# Patient Record
Sex: Female | Born: 1960 | Race: White | Hispanic: No | Marital: Married | Smoking: Never smoker
Health system: Southern US, Community
[De-identification: ages and names within clinical notes are randomized; demographics above are authoritative.]

## PROBLEM LIST (undated history)

## (undated) DIAGNOSIS — IMO0002 Reserved for concepts with insufficient information to code with codable children: Secondary | ICD-10-CM

## (undated) DIAGNOSIS — E785 Hyperlipidemia, unspecified: Secondary | ICD-10-CM

## (undated) DIAGNOSIS — J302 Other seasonal allergic rhinitis: Secondary | ICD-10-CM

## (undated) DIAGNOSIS — C801 Malignant (primary) neoplasm, unspecified: Secondary | ICD-10-CM

## (undated) DIAGNOSIS — I10 Essential (primary) hypertension: Secondary | ICD-10-CM

## (undated) HISTORY — PX: ABDOMINAL HYSTERECTOMY: SHX81

## (undated) HISTORY — PX: TONSILLECTOMY: SUR1361

## (undated) HISTORY — DX: Essential (primary) hypertension: I10

## (undated) HISTORY — DX: Reserved for concepts with insufficient information to code with codable children: IMO0002

---

## 1998-08-29 ENCOUNTER — Other Ambulatory Visit: Admission: RE | Admit: 1998-08-29 | Discharge: 1998-08-29 | Payer: Self-pay | Admitting: *Deleted

## 1999-09-06 ENCOUNTER — Other Ambulatory Visit: Admission: RE | Admit: 1999-09-06 | Discharge: 1999-09-06 | Payer: Self-pay | Admitting: *Deleted

## 1999-10-29 ENCOUNTER — Encounter: Payer: Self-pay | Admitting: *Deleted

## 1999-10-29 ENCOUNTER — Encounter: Admission: RE | Admit: 1999-10-29 | Discharge: 1999-10-29 | Payer: Self-pay | Admitting: *Deleted

## 2000-09-15 ENCOUNTER — Other Ambulatory Visit: Admission: RE | Admit: 2000-09-15 | Discharge: 2000-09-15 | Payer: Self-pay | Admitting: *Deleted

## 2001-09-22 ENCOUNTER — Other Ambulatory Visit: Admission: RE | Admit: 2001-09-22 | Discharge: 2001-09-22 | Payer: Self-pay | Admitting: *Deleted

## 2002-09-27 ENCOUNTER — Other Ambulatory Visit: Admission: RE | Admit: 2002-09-27 | Discharge: 2002-09-27 | Payer: Self-pay | Admitting: *Deleted

## 2003-10-10 ENCOUNTER — Other Ambulatory Visit: Admission: RE | Admit: 2003-10-10 | Discharge: 2003-10-10 | Payer: Self-pay | Admitting: *Deleted

## 2003-11-22 ENCOUNTER — Encounter: Admission: RE | Admit: 2003-11-22 | Discharge: 2003-11-22 | Payer: Self-pay | Admitting: *Deleted

## 2004-10-10 ENCOUNTER — Other Ambulatory Visit: Admission: RE | Admit: 2004-10-10 | Discharge: 2004-10-10 | Payer: Self-pay | Admitting: *Deleted

## 2004-11-22 ENCOUNTER — Encounter: Admission: RE | Admit: 2004-11-22 | Discharge: 2004-11-22 | Payer: Self-pay | Admitting: *Deleted

## 2004-12-03 ENCOUNTER — Other Ambulatory Visit: Admission: RE | Admit: 2004-12-03 | Discharge: 2004-12-03 | Payer: Self-pay | Admitting: *Deleted

## 2004-12-23 DIAGNOSIS — IMO0002 Reserved for concepts with insufficient information to code with codable children: Secondary | ICD-10-CM

## 2004-12-23 DIAGNOSIS — R87619 Unspecified abnormal cytological findings in specimens from cervix uteri: Secondary | ICD-10-CM

## 2004-12-23 HISTORY — DX: Unspecified abnormal cytological findings in specimens from cervix uteri: R87.619

## 2004-12-23 HISTORY — DX: Reserved for concepts with insufficient information to code with codable children: IMO0002

## 2004-12-23 HISTORY — PX: LEEP: SHX91

## 2005-06-04 ENCOUNTER — Other Ambulatory Visit: Admission: RE | Admit: 2005-06-04 | Discharge: 2005-06-04 | Payer: Self-pay | Admitting: *Deleted

## 2005-10-02 ENCOUNTER — Other Ambulatory Visit: Admission: RE | Admit: 2005-10-02 | Discharge: 2005-10-02 | Payer: Self-pay | Admitting: *Deleted

## 2005-12-11 ENCOUNTER — Encounter: Admission: RE | Admit: 2005-12-11 | Discharge: 2005-12-11 | Payer: Self-pay | Admitting: *Deleted

## 2006-10-16 ENCOUNTER — Other Ambulatory Visit: Admission: RE | Admit: 2006-10-16 | Discharge: 2006-10-16 | Payer: Self-pay | Admitting: *Deleted

## 2006-12-12 ENCOUNTER — Encounter: Admission: RE | Admit: 2006-12-12 | Discharge: 2006-12-12 | Payer: Self-pay | Admitting: *Deleted

## 2007-12-31 ENCOUNTER — Other Ambulatory Visit: Admission: RE | Admit: 2007-12-31 | Discharge: 2007-12-31 | Payer: Self-pay | Admitting: *Deleted

## 2008-02-16 ENCOUNTER — Encounter: Admission: RE | Admit: 2008-02-16 | Discharge: 2008-02-16 | Payer: Self-pay | Admitting: *Deleted

## 2009-06-22 ENCOUNTER — Encounter: Admission: RE | Admit: 2009-06-22 | Discharge: 2009-06-22 | Payer: Self-pay | Admitting: Obstetrics & Gynecology

## 2009-06-28 ENCOUNTER — Encounter: Admission: RE | Admit: 2009-06-28 | Discharge: 2009-06-28 | Payer: Self-pay | Admitting: Obstetrics & Gynecology

## 2010-07-23 ENCOUNTER — Encounter: Admission: RE | Admit: 2010-07-23 | Discharge: 2010-07-23 | Payer: Self-pay | Admitting: Obstetrics and Gynecology

## 2010-07-25 ENCOUNTER — Encounter
Admission: RE | Admit: 2010-07-25 | Discharge: 2010-07-25 | Payer: Self-pay | Source: Home / Self Care | Admitting: Obstetrics and Gynecology

## 2011-01-12 ENCOUNTER — Inpatient Hospital Stay (HOSPITAL_COMMUNITY)
Admission: EM | Admit: 2011-01-12 | Discharge: 2011-01-12 | Payer: Self-pay | Source: Home / Self Care | Attending: Internal Medicine | Admitting: Internal Medicine

## 2011-01-13 ENCOUNTER — Encounter: Payer: Self-pay | Admitting: *Deleted

## 2011-01-14 ENCOUNTER — Encounter: Payer: Self-pay | Admitting: Obstetrics & Gynecology

## 2011-07-05 ENCOUNTER — Other Ambulatory Visit: Payer: Self-pay | Admitting: Obstetrics and Gynecology

## 2011-07-05 DIAGNOSIS — Z1231 Encounter for screening mammogram for malignant neoplasm of breast: Secondary | ICD-10-CM

## 2011-07-31 ENCOUNTER — Ambulatory Visit
Admission: RE | Admit: 2011-07-31 | Discharge: 2011-07-31 | Disposition: A | Discharge status: Home / Self Care | Payer: PRIVATE HEALTH INSURANCE | Source: Ambulatory Visit | Attending: Obstetrics and Gynecology | Admitting: Obstetrics and Gynecology

## 2011-07-31 DIAGNOSIS — Z1231 Encounter for screening mammogram for malignant neoplasm of breast: Secondary | ICD-10-CM

## 2012-10-20 ENCOUNTER — Other Ambulatory Visit: Payer: Self-pay | Admitting: Obstetrics and Gynecology

## 2012-10-20 DIAGNOSIS — Z1231 Encounter for screening mammogram for malignant neoplasm of breast: Secondary | ICD-10-CM

## 2012-12-01 ENCOUNTER — Ambulatory Visit
Admission: RE | Admit: 2012-12-01 | Discharge: 2012-12-01 | Disposition: A | Discharge status: Home / Self Care | Payer: PRIVATE HEALTH INSURANCE | Source: Ambulatory Visit | Attending: Obstetrics and Gynecology | Admitting: Obstetrics and Gynecology

## 2012-12-01 DIAGNOSIS — Z1231 Encounter for screening mammogram for malignant neoplasm of breast: Secondary | ICD-10-CM

## 2012-12-03 ENCOUNTER — Other Ambulatory Visit: Payer: Self-pay | Admitting: Obstetrics and Gynecology

## 2012-12-03 DIAGNOSIS — R928 Other abnormal and inconclusive findings on diagnostic imaging of breast: Secondary | ICD-10-CM

## 2012-12-09 ENCOUNTER — Other Ambulatory Visit: Payer: Self-pay | Admitting: Obstetrics and Gynecology

## 2012-12-09 DIAGNOSIS — R928 Other abnormal and inconclusive findings on diagnostic imaging of breast: Secondary | ICD-10-CM

## 2012-12-18 ENCOUNTER — Ambulatory Visit
Admission: RE | Admit: 2012-12-18 | Discharge: 2012-12-18 | Disposition: A | Discharge status: Home / Self Care | Payer: PRIVATE HEALTH INSURANCE | Source: Ambulatory Visit | Attending: Obstetrics and Gynecology | Admitting: Obstetrics and Gynecology

## 2012-12-18 DIAGNOSIS — R928 Other abnormal and inconclusive findings on diagnostic imaging of breast: Secondary | ICD-10-CM

## 2013-07-12 ENCOUNTER — Ambulatory Visit: Payer: Self-pay | Admitting: Nurse Practitioner

## 2013-07-22 ENCOUNTER — Encounter: Payer: Self-pay | Admitting: *Deleted

## 2013-07-26 ENCOUNTER — Encounter: Payer: Self-pay | Admitting: Nurse Practitioner

## 2013-07-26 ENCOUNTER — Ambulatory Visit (INDEPENDENT_AMBULATORY_CARE_PROVIDER_SITE_OTHER): Payer: PRIVATE HEALTH INSURANCE | Admitting: Nurse Practitioner

## 2013-07-26 VITALS — BP 116/70 | HR 72 | Ht 64.75 in | Wt 127.0 lb

## 2013-07-26 DIAGNOSIS — Z0000 Encounter for general adult medical examination without abnormal findings: Secondary | ICD-10-CM

## 2013-07-26 DIAGNOSIS — Z01419 Encounter for gynecological examination (general) (routine) without abnormal findings: Secondary | ICD-10-CM

## 2013-07-26 NOTE — Patient Instructions (Signed)

## 2013-07-26 NOTE — Progress Notes (Addendum)
Patient ID: Zoe Sherman, female   DOB: 1961-11-17, 52 y.o.   MRN: [id number] 52 y.o. G2P2 Married Caucasian Fe here for annual exam.  Menses have been a little irregular but never over 90 days.  She came in for a consult visit with Dr. Tresa Res in November 2013.  It was decided that she would take Lo Loestrin which she only took for 1 month.  She did not like side effects and how she felt so discontinued. Menses since then are as follows: Jan 20, March 17; April 29; May 24; July 14.  Usually last X 7 days light to moderate flow. No cramps..  Then sometimes a week later spotting with wiping that is light pink that goes away in 1-2 days. Rare night sweats. She feels she does not want hormonal intervention.  Patient's last menstrual period was 07/05/2013.          Sexually active: yes  The current method of family planning is vasectomy.    Exercising: yes  Home exercise routine includes walking .75 hrs per 5 days per week. Smoker:  no  Health Maintenance: Pap:  07/09/12, WNL MMG:  12/18/12, BI-Rads 1 negative after repeat films, no biopsy Colonoscopy:  Never, aware that we can schedule for her.  TDaP:  05/2013 Labs: PCP   reports that she has never smoked. She has never used smokeless tobacco. She reports that she does not drink alcohol or use illicit drugs.  Past Medical History  Diagnosis Date  . Abnormal Pap smear     Past Surgical History  Procedure Laterality Date  . Leep  2006  . Cesarean section  03/1992  . Vaginal delivery  12/1989    Current Outpatient Prescriptions  Medication Sig Dispense Refill  . ibuprofen (ADVIL,MOTRIN) 100 MG tablet Take 100 mg by mouth every 6 (six) hours as needed for fever.       No current facility-administered medications for this visit.    Family History  Problem Relation Age of Onset  . Hypertension Mother   . Hypertension Father   . Diabetes Father   . Hyperlipidemia Father   . Obesity Brother   . Heart failure Paternal Aunt   . Heart  failure Paternal Grandfather     ROS:  Pertinent items are noted in HPI.  Otherwise, a comprehensive ROS was negative.  Exam:   BP 116/70  Pulse 72  Ht 5' 4.75" (1.645 m)  Wt 127 lb (57.607 kg)  BMI 21.29 kg/m2  LMP 07/05/2013 Height: 5' 4.75" (164.5 cm)  Ht Readings from Last 3 Encounters:  07/26/13 5' 4.75" (1.645 m)    General appearance: alert, cooperative and appears stated age Head: Normocephalic, without obvious abnormality, atraumatic Neck: no adenopathy, supple, symmetrical, trachea midline and thyroid normal to inspection and palpation Lungs: clear to auscultation bilaterally Breasts: normal appearance, no masses or tenderness Heart: regular rate and rhythm Abdomen: soft, non-tender; no masses,  no organomegaly Extremities: extremities normal, atraumatic, no cyanosis or edema Skin: Skin color, texture, turgor normal. No rashes or lesions Lymph nodes: Cervical, supraclavicular, and axillary nodes normal. No abnormal inguinal nodes palpated Neurologic: Grossly normal   Pelvic: External genitalia:  no lesions              Urethra:  normal appearing urethra with no masses, tenderness or lesions              Bartholin's and Skene's: normal  Vagina: normal appearing vagina with normal color and discharge, no lesions              Cervix: anteverted              Pap taken: yesand HR HPV Bimanual Exam:  Uterus:  normal size, contour, position, consistency, mobility, non-tender              Adnexa: no mass, fullness, tenderness               Rectovaginal: Confirms               Anus:  normal sphincter tone, no lesions  A:  Well Woman with normal exam  Perimenopausal consistent with irregular cycles  History of  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  Will CB if vaso symptoms worsen  counseled on  breast self exam, adequate intake of calcium and vitamin D, diet and exercise  [facility], in past tried to get records from Dr. Delorse Lek office. = old records reviewed see note above and scanned. return annually or prn  An After Visit Summary was printed and given to the patient.

## 2013-07-28 NOTE — Progress Notes (Signed)
Encounter reviewed by Dr. Brook Silva.  

## 2013-08-20 ENCOUNTER — Encounter: Payer: Self-pay | Admitting: Nurse Practitioner

## 2013-11-23 ENCOUNTER — Other Ambulatory Visit: Payer: Self-pay

## 2013-11-23 DIAGNOSIS — Z1231 Encounter for screening mammogram for malignant neoplasm of breast: Secondary | ICD-10-CM

## 2014-01-03 ENCOUNTER — Ambulatory Visit
Admission: RE | Admit: 2014-01-03 | Discharge: 2014-01-03 | Disposition: A | Discharge status: Home / Self Care | Payer: PRIVATE HEALTH INSURANCE | Source: Ambulatory Visit

## 2014-01-03 DIAGNOSIS — Z1231 Encounter for screening mammogram for malignant neoplasm of breast: Secondary | ICD-10-CM

## 2014-08-01 ENCOUNTER — Encounter: Payer: Self-pay | Admitting: Nurse Practitioner

## 2014-08-01 ENCOUNTER — Ambulatory Visit (INDEPENDENT_AMBULATORY_CARE_PROVIDER_SITE_OTHER): Payer: PRIVATE HEALTH INSURANCE | Admitting: Nurse Practitioner

## 2014-08-01 VITALS — BP 130/70 | HR 72 | Ht 65.0 in | Wt 132.0 lb

## 2014-08-01 DIAGNOSIS — Z01419 Encounter for gynecological examination (general) (routine) without abnormal findings: Secondary | ICD-10-CM

## 2014-08-01 DIAGNOSIS — Z1211 Encounter for screening for malignant neoplasm of colon: Secondary | ICD-10-CM

## 2014-08-01 NOTE — Progress Notes (Signed)
Patient ID: Zoe Sherman, female   DOB: 30-Sep-1961, 53 y.o.   MRN: [id number] 53 y.o. G2P2 Married Caucasian Fe here for annual exam.  Menses is irregular this past year. PMP 02/2014.  Then amenorrhea until 07/15/14. Some vaso symptoms that are worse at night.   Maybe 6 cycles this past year.  No vaginal dryness.   Patient's last menstrual period was 07/15/2014.          Sexually active: yes  The current method of family planning is vasectomy.  Exercising: yes Home exercise routine includes walking 2-2.5 milers 4-5 times per week.  Smoker: no   Health Maintenance:  Pap: 07/26/13, WNL, neg HR HPV MMG:  01/03/14, 3D, Bi-Rads 1:  Negative  Colonoscopy: Never, aware that we can schedule for her.  TDaP: 05/2013  Labs: PCP   reports that she has never smoked. She has never used smokeless tobacco. She reports that she does not drink alcohol or use illicit drugs.  Past Medical History  Diagnosis Date  . Abnormal Pap smear 2006    ??? LEEP Dr. Randell Patient - unable to get records.    Past Surgical History  Procedure Laterality Date  . Leep  2006    pathology shows CIN I with HPV effect, no squamous intraepitheal lesion  . Cesarean section  03/1992  . Vaginal delivery  12/1989    Current Outpatient Prescriptions  Medication Sig Dispense Refill  . ibuprofen (ADVIL,MOTRIN) 100 MG tablet Take 100 mg by mouth every 6 (six) hours as needed for fever.       No current facility-administered medications for this visit.    Family History  Problem Relation Age of Onset  . Hypertension Mother   . Hypertension Father   . Diabetes Father   . Hyperlipidemia Father   . Obesity Brother   . Heart failure Paternal Aunt   . Heart failure Paternal Grandfather   . Obesity Sister   . Hypertension Sister   . Hypertension Sister   . Obesity Sister     ROS:  Pertinent items are noted in HPI.  Otherwise, a comprehensive ROS was negative.  Exam:   BP 130/70  Pulse 72  Ht 5\' 5"  (1.651 m)  Wt 132 lb (59.875 kg)   BMI 21.97 kg/m2  LMP 07/15/2014 Height: 5\' 5"  (165.1 cm)  Ht Readings from Last 3 Encounters:  08/01/14 5\' 5"  (1.651 m)  07/26/13 5' 4.75" (1.645 m)    General appearance: alert, cooperative and appears stated age Head: Normocephalic, without obvious abnormality, atraumatic Neck: no adenopathy, supple, symmetrical, trachea midline and thyroid normal to inspection and palpation Lungs: clear to auscultation bilaterally Breasts: normal appearance, no masses or tenderness Heart: regular rate and rhythm Abdomen: soft, non-tender; no masses,  no organomegaly Extremities: extremities normal, atraumatic, no cyanosis or edema Skin: Skin color, texture, turgor normal. No rashes or lesions Lymph nodes: Cervical, supraclavicular, and axillary nodes normal. No abnormal inguinal nodes palpated Neurologic: Grossly normal   Pelvic: External genitalia:  no lesions              Urethra:  normal appearing urethra with no masses, tenderness or lesions              Bartholin's and Skene's: normal                 Vagina: normal appearing vagina with normal color and discharge, no lesions              Cervix: anteverted  Pap taken: No. Bimanual Exam:  Uterus:  normal size, contour, position, consistency, mobility, non-tender              Adnexa: no mass, fullness, tenderness               Rectovaginal: Confirms               Anus:  normal sphincter tone, no lesions  A:  Well Woman with normal exam  Perimenopause C/W irregular menses    P:   Reviewed health and wellness pertinent to exam  Pap smear not taken today  Mammogram is due 12/2014  Will schedule colonoscopy with Dr. Loreta AveMann  Will monitor menses calendar and if no menses in 3 months to call back for Provera  Counseled on breast self exam, mammography screening, adequate intake of calcium and vitamin D, diet and exercise return annually or prn  An After Visit Summary was printed and given to the patient.

## 2014-08-01 NOTE — Patient Instructions (Signed)

## 2014-08-08 NOTE — Progress Notes (Signed)
Encounter reviewed by Dr. Shanyn Preisler Silva.  

## 2014-10-24 ENCOUNTER — Encounter: Payer: Self-pay | Admitting: Nurse Practitioner

## 2015-01-03 ENCOUNTER — Other Ambulatory Visit: Payer: Self-pay

## 2015-01-03 DIAGNOSIS — Z1231 Encounter for screening mammogram for malignant neoplasm of breast: Secondary | ICD-10-CM

## 2015-01-16 ENCOUNTER — Ambulatory Visit
Admission: RE | Admit: 2015-01-16 | Discharge: 2015-01-16 | Disposition: A | Discharge status: Home / Self Care | Payer: PRIVATE HEALTH INSURANCE | Source: Ambulatory Visit

## 2015-01-16 DIAGNOSIS — Z1231 Encounter for screening mammogram for malignant neoplasm of breast: Secondary | ICD-10-CM

## 2015-08-07 ENCOUNTER — Encounter: Payer: Self-pay | Admitting: Nurse Practitioner

## 2015-08-07 ENCOUNTER — Ambulatory Visit (INDEPENDENT_AMBULATORY_CARE_PROVIDER_SITE_OTHER): Payer: PRIVATE HEALTH INSURANCE | Admitting: Nurse Practitioner

## 2015-08-07 VITALS — BP 118/66 | HR 72 | Ht 64.75 in | Wt 130.0 lb

## 2015-08-07 DIAGNOSIS — Z0000 Encounter for general adult medical examination without abnormal findings: Secondary | ICD-10-CM

## 2015-08-07 DIAGNOSIS — Z01419 Encounter for gynecological examination (general) (routine) without abnormal findings: Secondary | ICD-10-CM | POA: Diagnosis not present

## 2015-08-07 DIAGNOSIS — Z1211 Encounter for screening for malignant neoplasm of colon: Secondary | ICD-10-CM | POA: Diagnosis not present

## 2015-08-07 DIAGNOSIS — N912 Amenorrhea, unspecified: Secondary | ICD-10-CM | POA: Diagnosis not present

## 2015-08-07 LAB — POCT URINALYSIS DIPSTICK
BILIRUBIN UA: NEGATIVE
Blood, UA: NEGATIVE
GLUCOSE UA: NEGATIVE
Ketones, UA: NEGATIVE
Leukocytes, UA: NEGATIVE
NITRITE UA: NEGATIVE
Protein, UA: NEGATIVE
UROBILINOGEN UA: NEGATIVE
pH, UA: 6

## 2015-08-07 MED ORDER — MEDROXYPROGESTERONE ACETATE 10 MG PO TABS: 10.0000 mg | ORAL_TABLET | Freq: Every day | ORAL | Status: DC

## 2015-08-07 NOTE — Patient Instructions (Addendum)

## 2015-08-07 NOTE — Progress Notes (Signed)
Patient ID: Zoe Sherman, female   DOB: Jan 30, 1961, 54 y.o.   MRN: [id number] 54 y.o. G2P0002 Married  Caucasian Fe here for annual exam.  Menses last July 2015, then amenorrhea until April 2016 which was normal and not heavy.  She has only minor vaso symptoms and thinks they are better than last year.  Oldest son now married for 2 years.  The youngest son is getting married on 09/02/15.  Patient's last menstrual period was 03/24/2015 (approximate).          Sexually active: Yes.    The current method of family planning is none.    Exercising: Yes.    walking 1.5-2 miles everyday weather permits Smoker:  no  Health Maintenance: Pap: 07/26/13, WNL, neg HR HPV MMG: 01/16/15, 3D, Bi-Rads 1: Negative  Colonoscopy: Never  TDaP: 05/2013  Labs:  PCP  Urine:  Negative    reports that she has never smoked. She has never used smokeless tobacco. She reports that she does not drink alcohol or use illicit drugs.  Past Medical History  Diagnosis Date  . Abnormal Pap smear 2006    ??? LEEP Dr. Randell Patient - unable to get records.    Past Surgical History  Procedure Laterality Date  . Leep  2006    pathology shows CIN I with HPV effect, no squamous intraepitheal lesion  . Cesarean section  03/1992  . Vaginal delivery  12/1989    Current Outpatient Prescriptions  Medication Sig Dispense Refill  . ibuprofen (ADVIL,MOTRIN) 100 MG tablet Take 100 mg by mouth every 6 (six) hours as needed for fever.    . medroxyPROGESTERone (PROVERA) 10 MG tablet Take 1 tablet (10 mg total) by mouth daily. 10 tablet 0   No current facility-administered medications for this visit.    Family History  Problem Relation Age of Onset  . Hypertension Mother   . Hypertension Father   . Diabetes Father   . Hyperlipidemia Father   . Obesity Brother   . Heart failure Paternal Aunt   . Heart failure Paternal Grandfather   . Obesity Sister   . Hypertension Sister   . Hypertension Sister   . Obesity Sister     ROS:   Pertinent items are noted in HPI.  Otherwise, a comprehensive ROS was negative.  Exam:   BP 118/66 mmHg  Pulse 72  Ht 5' 4.75" (1.645 m)  Wt 130 lb (58.968 kg)  BMI 21.79 kg/m2  LMP 03/24/2015 (Approximate) Height: 5' 4.75" (164.5 cm) Ht Readings from Last 3 Encounters:  08/07/15 5' 4.75" (1.645 m)  08/01/14 5\' 5"  (1.651 m)  07/26/13 5' 4.75" (1.645 m)    General appearance: alert, cooperative and appears stated age Head: Normocephalic, without obvious abnormality, atraumatic Neck: no adenopathy, supple, symmetrical, trachea midline and thyroid normal to inspection and palpation Lungs: clear to auscultation bilaterally Breasts: normal appearance, no masses or tenderness Heart: regular rate and rhythm Abdomen: soft, non-tender; no masses,  no organomegaly Extremities: extremities normal, atraumatic, no cyanosis or edema Skin: Skin color, texture, turgor normal. No rashes or lesions Lymph nodes: Cervical, supraclavicular, and axillary nodes normal. No abnormal inguinal nodes palpated Neurologic: Grossly normal   Pelvic: External genitalia:  no lesions              Urethra:  normal appearing urethra with no masses, tenderness or lesions              Bartholin's and Skene's: normal  Vagina: normal appearing vagina with normal color and discharge, no lesions              Cervix: anteverted              Pap taken: No. Bimanual Exam:  Uterus:  normal size, contour, position, consistency, mobility, non-tender              Adnexa: no mass, fullness, tenderness               Rectovaginal: Confirms               Anus:  normal sphincter tone, no lesions  Chaperone present:  no  A:  Well Woman with normal exam Perimenopause C/W irregular menses   P:   Reviewed health and wellness pertinent to exam  Pap smear as above  Mammogram is due 12/2015  Will place order for colonoscopy - she had to cancel last apt.  Will do a Provera challenge - she will not take  med's until after her sons wedding 08/24/15.  Then she will call back with +/- results.    Will check FSH level today.  Counseled on breast self exam, mammography screening, menopause, adequate intake of calcium and vitamin D, diet and exercise return annually or prn  An After Visit Summary was printed and given to the patient.

## 2015-08-08 LAB — FOLLICLE STIMULATING HORMONE: FSH: 76.1 m[IU]/mL

## 2015-08-08 NOTE — Progress Notes (Signed)
Encounter reviewed by Dr. Brook Amundson C. Silva.  

## 2015-09-01 ENCOUNTER — Telehealth: Payer: Self-pay | Admitting: Nurse Practitioner

## 2015-09-01 NOTE — Telephone Encounter (Signed)
Left voicemail regarding referral appointment. The information is listed below. Should the patient need to cancel or reschedule this appointment, please advise them to call the office they've been referred to in order to reschedule.  Dr Loreta Ave @ [facility] [address], [place], [place] [address] [phone]  09/14/15  - arrive 15 minutes early, bring insurance card, photo id, copay

## 2015-10-27 ENCOUNTER — Telehealth: Payer: Self-pay | Admitting: Nurse Practitioner

## 2015-10-27 NOTE — Telephone Encounter (Signed)
Patient was on provera for 10 days and has not had any bleeding.

## 2015-10-27 NOTE — Telephone Encounter (Signed)
At her AEX she had an FSH of 76.1 - so not terribly surprised that she did not have a cycle.  No further labs are needed at this time for checking for menopause.  I believe she had TSH at PCP - results not in Epic.  That would be the only test I would suggest.  Of course if any bleeding to call ASAP.

## 2015-10-27 NOTE — Telephone Encounter (Signed)
Spoke with patient. Patient states she took her last dose of Prover on 10/17 and has not had any bleeding. Advised I will provide Ria CommentPatricia Grubb, FNP an update and return call with further recommendations. Patient is agreeable.

## 2015-10-27 NOTE — Telephone Encounter (Signed)
Spoke with patient. Advised of message as seen below from Ria CommentPatricia Grubb, FNP. Patient states she has had her TSH checked in the past and it was normal. States it has been a while she believes. Declines to schedule lab appointment for TSH check at this time. Will monitor for any further bleeding and notify our office if this occurs.  Routing to provider for final review. Patient agreeable to disposition. Will close encounter.

## 2015-12-11 ENCOUNTER — Other Ambulatory Visit: Payer: Self-pay

## 2015-12-11 DIAGNOSIS — Z1231 Encounter for screening mammogram for malignant neoplasm of breast: Secondary | ICD-10-CM

## 2016-01-22 ENCOUNTER — Ambulatory Visit
Admission: RE | Admit: 2016-01-22 | Discharge: 2016-01-22 | Disposition: A | Discharge status: Home / Self Care | Payer: PRIVATE HEALTH INSURANCE | Source: Ambulatory Visit

## 2016-01-22 DIAGNOSIS — Z1231 Encounter for screening mammogram for malignant neoplasm of breast: Secondary | ICD-10-CM

## 2016-08-12 ENCOUNTER — Encounter: Payer: Self-pay | Admitting: Nurse Practitioner

## 2016-08-12 ENCOUNTER — Ambulatory Visit (INDEPENDENT_AMBULATORY_CARE_PROVIDER_SITE_OTHER): Payer: PRIVATE HEALTH INSURANCE | Admitting: Nurse Practitioner

## 2016-08-12 ENCOUNTER — Ambulatory Visit: Payer: PRIVATE HEALTH INSURANCE | Admitting: Nurse Practitioner

## 2016-08-12 VITALS — BP 122/76 | HR 64 | Ht 64.25 in | Wt 135.0 lb

## 2016-08-12 DIAGNOSIS — Z01419 Encounter for gynecological examination (general) (routine) without abnormal findings: Secondary | ICD-10-CM

## 2016-08-12 DIAGNOSIS — Z1211 Encounter for screening for malignant neoplasm of colon: Secondary | ICD-10-CM

## 2016-08-12 DIAGNOSIS — Z0000 Encounter for general adult medical examination without abnormal findings: Secondary | ICD-10-CM | POA: Diagnosis not present

## 2016-08-12 NOTE — Progress Notes (Signed)
Patient ID: Zoe Sherman, female   DOB: Apr 01, 1961, 55 y.o.   MRN: [id number]   55 y.o. G2P0002 Married  Caucasian Fe here for annual exam.  Provera challenge in 08/2015, no withdrawal bleed, [facility]  Occasional vaso symptoms that are tolerable.  Right ear drum rupture in April this year secondary to allergy.  Patient's last menstrual period was 03/24/2015 (within weeks).          Sexually active: Yes.    The current method of family planning is post menopausal status.    Exercising: Yes.   Walking 1.5-2 miles at least 4 times per week Smoker:  no  Health Maintenance: Pap: 07/26/13, Negative with neg HR HPV MMG:01/22/16, 3D, Bi-Rads 1: Negative  Colonoscopy: Never - did not go to previous apt due to her work schedule. BMD: ? [facility] TDaP: 05/2013  Hep C and HIV: done today Labs: PCP takes care of labs  Urine: negative   reports that she has never smoked. She has never used smokeless tobacco. She reports that she does not drink alcohol or use drugs.  Past Medical History:  Diagnosis Date  . Abnormal Pap smear 2006   ??? LEEP Dr. Randell PatientFore - unable to get records.    Past Surgical History:  Procedure Laterality Date  . CESAREAN SECTION  03/1992  . LEEP  2006   pathology shows CIN I with HPV effect, no squamous intraepitheal lesion  . VAGINAL DELIVERY  12/1989    Current Outpatient Prescriptions  Medication Sig Dispense Refill  . ibuprofen (ADVIL,MOTRIN) 100 MG tablet Take 100 mg by mouth every 6 (six) hours as needed for fever.    Boris Lown. Krill Oil 1000 MG CAPS Take 1 capsule by mouth daily.    . Multiple Vitamin (MULTIVITAMIN) tablet Take 1 tablet by mouth daily.     No current facility-administered medications for this visit.     Family History  Problem Relation Age of Onset  . Hypertension Mother   . Hypertension Father   . Diabetes Father   . Hyperlipidemia Father   . Obesity Brother   . Obesity Sister   . Hypertension  Sister   . Hypertension Sister   . Obesity Sister   . Heart failure Paternal Aunt   . Heart failure Paternal Grandfather     ROS:  Pertinent items are noted in HPI.  Otherwise, a comprehensive ROS was negative.  Exam:   BP 122/76 (BP Location: Right Arm, Patient Position: Sitting, Cuff Size: Normal)   Pulse 64   Ht 5' 4.25" (1.632 m)   Wt 135 lb (61.2 kg)   LMP 03/24/2015 (Within Weeks)   BMI 22.99 kg/m  Height: 5' 4.25" (163.2 cm) Ht Readings from Last 3 Encounters:  08/12/16 5' 4.25" (1.632 m)  08/07/15 5' 4.75" (1.645 m)  08/01/14 5\' 5"  (1.651 m)    General appearance: alert, cooperative and appears stated age Head: Normocephalic, without obvious abnormality, atraumatic Neck: no adenopathy, supple, symmetrical, trachea midline and thyroid normal to inspection and palpation Lungs: clear to auscultation bilaterally Breasts: normal appearance, no masses or tenderness Heart: regular rate and rhythm Abdomen: soft, non-tender; no masses,  no organomegaly Extremities: extremities normal, atraumatic, no cyanosis or edema Skin: Skin color, texture, turgor normal. No rashes or lesions Lymph nodes: Cervical, supraclavicular, and axillary nodes normal. No abnormal inguinal nodes palpated Neurologic: Grossly normal   Pelvic: External genitalia:  no lesions  Urethra:  normal appearing urethra with no masses, tenderness or lesions              Bartholin's and Skene's: normal                 Vagina: normal appearing vagina with normal color and discharge, no lesions              Cervix: anteverted              Pap taken: Yes.    Note:  Pt desires pap yearly Bimanual Exam:  Uterus:  normal size, contour, position, consistency, mobility, non-tender              Adnexa: no mass, fullness, tenderness               Rectovaginal: Confirms               Anus:  normal sphincter tone, no lesions  Chaperone present: yes  A:  Well Woman with normal exam  Menopausal  - no  HRT  History of high cholesterol  Remote history of abnormal pap 2006 with CIN I - normal since   P:   Reviewed health and wellness pertinent to exam  Pap smear is done  Mammogram is due 12/2016  She is aware to call back if any bleeding  Referral to Dr. Loreta AveMann for colonoscopy  Counseled on breast self exam, mammography screening, adequate intake of calcium and vitamin D, diet and exercise, Kegel's exercises return annually or prn  An After Visit Summary was printed and given to the patient.

## 2016-08-12 NOTE — Progress Notes (Signed)
Reviewed personally.  M. Suzanne Erica Osuna, MD.  

## 2016-08-12 NOTE — Patient Instructions (Addendum)

## 2016-08-14 LAB — IPS PAP TEST WITH HPV

## 2016-12-20 ENCOUNTER — Other Ambulatory Visit: Payer: Self-pay | Admitting: Nurse Practitioner

## 2016-12-20 DIAGNOSIS — Z1231 Encounter for screening mammogram for malignant neoplasm of breast: Secondary | ICD-10-CM

## 2017-01-22 ENCOUNTER — Ambulatory Visit
Admission: RE | Admit: 2017-01-22 | Discharge: 2017-01-22 | Disposition: A | Discharge status: Home / Self Care | Payer: PRIVATE HEALTH INSURANCE | Source: Ambulatory Visit | Attending: Nurse Practitioner | Admitting: Nurse Practitioner

## 2017-01-22 DIAGNOSIS — Z1231 Encounter for screening mammogram for malignant neoplasm of breast: Secondary | ICD-10-CM

## 2017-03-10 ENCOUNTER — Telehealth: Payer: Self-pay | Admitting: Nurse Practitioner

## 2017-03-10 NOTE — Telephone Encounter (Signed)
Spoke with patient. Patient states she has been experiencing spotting, light pink, since 3/18. Patient states she is post menopausal. Patient denies pain, vaginal discharge, urinary complaints or partner changes. Recommended OV for further evaluation with physician. Patient states she will need appointment after 1pm. Patient scheduled for 03/12/17 at 3pm with Dr. Edward JollySilva. Patient verbalizes understanding and is agreeable to date and time.  Routing to provider for final review. Patient is agreeable to disposition. Will close encounter.   Cc: Ria CommentPatricia Grubb, NP

## 2017-03-10 NOTE — Telephone Encounter (Signed)
Patient is in menopause and noticed a light pink discharge that started yesterday.

## 2017-03-12 ENCOUNTER — Encounter: Payer: Self-pay | Admitting: Obstetrics and Gynecology

## 2017-03-12 ENCOUNTER — Ambulatory Visit (INDEPENDENT_AMBULATORY_CARE_PROVIDER_SITE_OTHER): Payer: PRIVATE HEALTH INSURANCE | Admitting: Obstetrics and Gynecology

## 2017-03-12 VITALS — BP 150/64 | HR 80 | Ht 64.25 in | Wt 136.2 lb

## 2017-03-12 DIAGNOSIS — N950 Postmenopausal bleeding: Secondary | ICD-10-CM | POA: Diagnosis not present

## 2017-03-12 NOTE — Patient Instructions (Signed)

## 2017-03-12 NOTE — Progress Notes (Signed)
GYNECOLOGY  VISIT   HPI: 56 y.o.   Married  Caucasian  female   G2P0002 with Patient's last menstrual period was 03/24/2015 (within weeks).   here for postmenopausal bleeding. Patient states began seeing some off & on light bleeding on 03-09-17.  Some bloating with the bleeding.   Last intercourse was 3 days prior to this.   No HRT.   FSH 76.1 on 08/07/15.  GYNECOLOGIC HISTORY: Patient's last menstrual period was 03/24/2015 (within weeks). Contraception:  Vasectomy/Postmenopausal Menopausal hormone therapy:  none Last mammogram: 01-22-17 Density B/Neg/BiRads1:TBC  Last pap smear: 08-12-16 Neg:neg HR HPV;07-26-14 Neg:Neg HR HPV        OB History    Gravida Para Term Preterm AB Living   2 2 0 0 0 2   SAB TAB Ectopic Multiple Live Births   0 0 0 0 2         There are no active problems to display for this patient.   Past Medical History:  Diagnosis Date  . Abnormal Pap smear 2006   CIN I    Past Surgical History:  Procedure Laterality Date  . CESAREAN SECTION  03/1992  . LEEP  2006   pathology shows CIN I with HPV effect, no squamous intraepitheal lesion  . VAGINAL DELIVERY  12/1989    Current Outpatient Prescriptions  Medication Sig Dispense Refill  . ibuprofen (ADVIL,MOTRIN) 100 MG tablet Take 100 mg by mouth every 6 (six) hours as needed for fever.    Boris Lown. Krill Oil 1000 MG CAPS Take 1 capsule by mouth daily.    . Multiple Vitamin (MULTIVITAMIN) tablet Take 1 tablet by mouth daily.     No current facility-administered medications for this visit.      ALLERGIES: Tetracyclines & related  Family History  Problem Relation Age of Onset  . Hypertension Mother   . Hypertension Father   . Diabetes Father   . Hyperlipidemia Father   . Obesity Brother   . Obesity Sister   . Hypertension Sister   . Hypertension Sister   . Obesity Sister   . Heart failure Paternal Aunt   . Heart failure Paternal Grandfather     Social History   Social History  . Marital status:  Married    Spouse name: N/A  . Number of children: 2  . Years of education: N/A   Occupational History  . Not on file.   Social History Main Topics  . Smoking status: Never Smoker  . Smokeless tobacco: Never Used  . Alcohol use No  . Drug use: No  . Sexual activity: Yes    Partners: Male    Birth control/ protection: Surgical     Comment: husband vasectomy   Other Topics Concern  . Not on file   Social History Narrative  . No narrative on file    ROS:  Pertinent items are noted in HPI.  PHYSICAL EXAMINATION:    BP (!) 150/64 (BP Location: Right Arm, Patient Position: Sitting, Cuff Size: Normal)   Pulse 80   Ht 5' 4.25" (1.632 m)   Wt 136 lb 3.2 oz (61.8 kg)   LMP 03/24/2015 (Within Weeks)   BMI 23.20 kg/m     General appearance: alert, cooperative and appears stated age   Pelvic: External genitalia:  no lesions              Urethra:  normal appearing urethra with no masses, tenderness or lesions  Bartholins and Skenes: normal                 Vagina: normal appearing vagina with normal color and discharge, no lesions              Cervix: no lesions.  Mild amount of blood in the vagina.                 Bimanual Exam:  Uterus:  normal size, contour, position, consistency, mobility, non-tender              Adnexa: no mass, fullness, tenderness        Procedure:  EMB Consent for procedure.  Sterile prep of cervix.  Paracervical block with 10 cc 1% lidocaine, lot [id number], exp 02/21. Os finder used.  Pipelle passed to 7 cm twice.  Tissue to GPA. Minimal EBL.  No complications.   Chaperone was present for exam.  ASSESSMENT  Postmenopausal bleeding.   PLAN  Discussion of postmenopausal bleeding and etiologies - atrophy, polyp, infection, fibroids, cancer. Post biopsy instruction and precautions given.  Return for pelvic ultrasound, possible sonohysterogram.  Procedure explained.    An After Visit Summary was printed and given to the  patient.  ___15__ minutes face to face time of which over 50% was spent in counseling.

## 2017-03-12 NOTE — Progress Notes (Signed)
Following appointment with Dr Edward JollySilva, sonohystogram scheduled for 03-20-17 at 1030 here in office. Patient instructed to take Motrin 800 mg one hour prior with food.

## 2017-03-17 ENCOUNTER — Telehealth: Payer: Self-pay

## 2017-03-17 NOTE — Telephone Encounter (Signed)
Left message to call Bexley Laubach at [phone]. 

## 2017-03-17 NOTE — Telephone Encounter (Signed)
-----   Message from Patton SallesBrook E Amundson C Silva, MD sent at 03/17/2017  1:02 PM EDT ----- Please report benign endometrial biopsy results. They saw breakdown and degeneration of this tissue which made it hard to evaluate for hyperplasia. No malignancy was seen.  Please keep appointment for this week for the sonohysterogram.   Cc- Claudette LawsAmanda Dixon

## 2017-03-18 NOTE — Telephone Encounter (Signed)
Spoke with patient. Advised of message and results as seen below from Dr.Silva. Patient is agreeable and verbalizes understanding.  Routing to provider for final review. Patient agreeable to disposition. Will close encounter.  

## 2017-03-20 ENCOUNTER — Other Ambulatory Visit: Payer: Self-pay | Admitting: Obstetrics and Gynecology

## 2017-03-20 ENCOUNTER — Ambulatory Visit (INDEPENDENT_AMBULATORY_CARE_PROVIDER_SITE_OTHER): Payer: PRIVATE HEALTH INSURANCE | Admitting: Obstetrics and Gynecology

## 2017-03-20 ENCOUNTER — Ambulatory Visit (INDEPENDENT_AMBULATORY_CARE_PROVIDER_SITE_OTHER): Payer: PRIVATE HEALTH INSURANCE

## 2017-03-20 VITALS — BP 138/74 | HR 66 | Ht 64.25 in | Wt 135.0 lb

## 2017-03-20 DIAGNOSIS — N9489 Other specified conditions associated with female genital organs and menstrual cycle: Secondary | ICD-10-CM

## 2017-03-20 DIAGNOSIS — N950 Postmenopausal bleeding: Secondary | ICD-10-CM | POA: Diagnosis not present

## 2017-03-20 NOTE — Progress Notes (Signed)
Encounter reviewed by Dr. Aiya Keach Amundson C. Silva.  

## 2017-03-20 NOTE — Progress Notes (Signed)
Patient ID: Zoe Sherman, female   DOB: 05/14/1961, 56 y.o.   MRN: [id number] GYNECOLOGY  VISIT   HPI: 56 y.o.   Married  Caucasian  female   G2P0002 with Patient's last menstrual period was 03/24/2015 (within weeks).   here for pelvic ultrasound for postmenopausal bleeding.    EMB showed breakdown of endometrium and potential signs of proliferation.  Difficult to evaluate for hyperplasia.  No HRT.   FSH 76.1 on 08/07/15.  GYNECOLOGIC HISTORY: Patient's last menstrual period was 03/24/2015 (within weeks). Contraception:  Vasectomy/Postmenopausal Menopausal hormone therapy:  none Last mammogram: 01-22-17 Density B/Neg/BiRads1:TBC   Last pap smear: 08-12-16 Neg:neg HR HPV;07-26-14 Neg:Neg HR HPV.  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        OB History    Gravida Para Term Preterm AB Living   2 2 0 0 0 2   SAB TAB Ectopic Multiple Live Births   0 0 0 0 2         There are no active problems to display for this patient.   Past Medical History:  Diagnosis Date  . Abnormal Pap smear 2006   CIN I    Past Surgical History:  Procedure Laterality Date  . CESAREAN SECTION  03/1992  . LEEP  2006   pathology shows CIN I with HPV effect, no squamous intraepitheal lesion  . VAGINAL DELIVERY  12/1989    Current Outpatient Prescriptions  Medication Sig Dispense Refill  . ibuprofen (ADVIL,MOTRIN) 100 MG tablet Take 100 mg by mouth every 6 (six) hours as needed for fever.    Boris Lown. Krill Oil 1000 MG CAPS Take 1 capsule by mouth daily.    . Multiple Vitamin (MULTIVITAMIN) tablet Take 1 tablet by mouth daily.     No current facility-administered medications for this visit.      ALLERGIES: Tetracyclines & related  Family History  Problem Relation Age of Onset  . Hypertension Mother   . Hypertension Father   . Diabetes Father   . Hyperlipidemia Father   . Obesity Brother   . Obesity Sister   . Hypertension Sister   . Hypertension Sister   . Obesity Sister   . Heart failure Paternal Aunt   . Heart  failure Paternal Grandfather     Social History   Social History  . Marital status: Married    Spouse name: N/A  . Number of children: 2  . Years of education: N/A   Occupational History  . Not on file.   Social History Main Topics  . Smoking status: Never Smoker  . Smokeless tobacco: Never Used  . Alcohol use No  . Drug use: No  . Sexual activity: Yes    Partners: Male    Birth control/ protection: Surgical     Comment: husband vasectomy   Other Topics Concern  . Not on file   Social History Narrative  . No narrative on file    ROS:  Pertinent items are noted in HPI.  PHYSICAL EXAMINATION:    BP 138/74 (BP Location: Right Arm, Patient Position: Sitting, Cuff Size: Normal)   Pulse 66   Ht 5' 4.25" (1.632 m)   Wt 135 lb (61.2 kg)   LMP 03/24/2015 (Within Weeks)   BMI 22.99 kg/m     General appearance: alert, cooperative and appears stated age   Technique:  Both transabdominal and transvaginal ultrasound examinations of the pelvis were performed. Transabdominal technique was performed for global imaging of the pelvis including uterus,  ovaries, adnexal regions, and pelvic cul-de-sac. It was necessary to proceed with endovaginal exam following the abdominal ultrasound.  Transabdominal exam to visualize the endometrium and adnexa.  Color and duplex Doppler ultrasound was utilized to evaluate blood flow to the ovaries.   Pelvic ultrasound: Uterus no masses. EMS 4.72 mm.  Ovaries normal. No free fluid.  Procedure - sonohysterogram Consent performed. Speculum placed in vagina. Sterile prep of cervix with Hibiclens. Cannula placed inside endometrial cavity without difficulty. Speculum removed. Sterile saline injected.      11 mm      filling defect noted. Cannula removed. No complication.   Chaperone was present for exam.  ASSESSMENT  Postmenopausal bleeding.  Endometrial mass.   PLAN  Discussion of postmenopausal bleeding and endometrial mass suspected  to be a polyp.  Discussion of hysteroscopy with Myosure polypectomy, dilation and curettage.  Risks, benefits, and alternatives reviewed. Risks include but are not limited to bleeding, infection, damage to surrounding organs including uterine perforation requiring hospitalization and laparoscopy, pulmonary edema, reaction to anesthesia, DVT, PE, death, need for further treatment and surgery including repeat hysteroscopy, hysterectomy or medical therapy.   Surgical expectations and recovery discussed.  Patient wishes to proceed. Call for heavy bleeding, increasing pain, or fever following sonohysterogram today.  An After Visit Summary was printed and given to the patient.  ___25___ minutes face to face time of which over 50% was spent in counseling.

## 2017-03-21 ENCOUNTER — Encounter: Payer: Self-pay | Admitting: Obstetrics and Gynecology

## 2017-03-26 ENCOUNTER — Telehealth: Payer: Self-pay | Admitting: Obstetrics and Gynecology

## 2017-03-26 NOTE — Telephone Encounter (Signed)
Return call to Suzy. °

## 2017-03-26 NOTE — Telephone Encounter (Signed)
Called patient to review benefits for a recommended surgery. Left Voicemail requesting a call back.  Routing to [employer]

## 2017-03-27 NOTE — Telephone Encounter (Signed)
Spoke with pt regarding benefit for recommended surgery. Patient understood and agreeable. Patient aware this is professional benefit only. Patient aware will be contacted by hospital for separate benefits. Patient advises she will call back to our office on 03/28/17 to confirm and proceed with scheduling.   Routing to [employer]

## 2017-03-27 NOTE — Telephone Encounter (Signed)
Returned call to patient. Left voicemail requesting a return call °

## 2017-03-31 NOTE — Telephone Encounter (Signed)
Return call from patient. Surgery date options discussed. Surgery scheduled for 04-14-17 at 0730 at [facility]. Surgery instruction sheet reviewed and printed copy will be mailed to patient.   Routing to provider for final review. Patient agreeable to disposition. Will close encounter.    Marland Kitchen

## 2017-03-31 NOTE — Telephone Encounter (Signed)
Call to patient, left message to call back. Calling patient to discuss surgery scheduling options.

## 2017-04-03 ENCOUNTER — Encounter: Payer: Self-pay | Admitting: Obstetrics and Gynecology

## 2017-04-03 ENCOUNTER — Ambulatory Visit (INDEPENDENT_AMBULATORY_CARE_PROVIDER_SITE_OTHER): Payer: PRIVATE HEALTH INSURANCE | Admitting: Obstetrics and Gynecology

## 2017-04-03 VITALS — BP 136/74 | HR 76 | Ht 64.25 in | Wt 136.6 lb

## 2017-04-03 DIAGNOSIS — N950 Postmenopausal bleeding: Secondary | ICD-10-CM

## 2017-04-03 DIAGNOSIS — N9489 Other specified conditions associated with female genital organs and menstrual cycle: Secondary | ICD-10-CM

## 2017-04-03 NOTE — Progress Notes (Signed)
GYNECOLOGY  VISIT   HPI: 56 y.o.   Married  Caucasian  female   G2P0002 with Patient's last menstrual period was 03/24/2015 (within weeks).   here for surgical consult.    Had postmenopausal bleeding.   Pelvic ultrasound and sonohysterogram 03/20/17 showing endometrial mass measuring 11 mm. EMB 03/12/17 - difficulty to evaluate for hyperplasia.  Signs of potential proliferation noted.   Bleeding stopped after one week.   FSH 76.1 on 08/07/15.  Not on HRT.  GYNECOLOGIC HISTORY: Patient's last menstrual period was 03/24/2015 (within weeks). Contraception:  Vasectomy/Postmenopausal Menopausal hormone therapy:  none Last mammogram: 01-22-17 Density B/Neg/BiRads1:TBC  Last pap smear:  08-12-16 Neg:neg HR HPV;07-26-14 Neg:Neg HR HPV.  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        OB History    Gravida Para Term Preterm AB Living   2 2 0 0 0 2   SAB TAB Ectopic Multiple Live Births   0 0 0 0 2         There are no active problems to display for this patient.   Past Medical History:  Diagnosis Date  . Abnormal Pap smear 2006   CIN I    Past Surgical History:  Procedure Laterality Date  . CESAREAN SECTION  03/1992  . LEEP  2006   pathology shows CIN I with HPV effect, no squamous intraepitheal lesion  . VAGINAL DELIVERY  12/1989    Current Outpatient Prescriptions  Medication Sig Dispense Refill  . Krill Oil 500 MG CAPS Take 1 capsule by mouth daily.    . Multiple Vitamin (MULTIVITAMIN) tablet Take 1 tablet by mouth daily.     No current facility-administered medications for this visit.      ALLERGIES: Tetracyclines & related  Family History  Problem Relation Age of Onset  . Hypertension Mother   . Hypertension Father   . Diabetes Father   . Hyperlipidemia Father   . Obesity Brother   . Obesity Sister   . Hypertension Sister   . Hypertension Sister   . Obesity Sister   . Heart failure Paternal Aunt   . Heart failure Paternal Grandfather     Social History   Social History   . Marital status: Married    Spouse name: N/A  . Number of children: 2  . Years of education: N/A   Occupational History  . Not on file.   Social History Main Topics  . Smoking status: Never Smoker  . Smokeless tobacco: Never Used  . Alcohol use No  . Drug use: No  . Sexual activity: Yes    Partners: Male    Birth control/ protection: Surgical     Comment: husband vasectomy   Other Topics Concern  . Not on file   Social History Narrative  . No narrative on file    ROS:  Pertinent items are noted in HPI.  PHYSICAL EXAMINATION:    BP 136/74 (BP Location: Right Arm, Patient Position: Sitting, Cuff Size: Normal)   Pulse 76   Ht 5' 4.25" (1.632 m)   Wt 136 lb 9.6 oz (62 kg)   LMP 03/24/2015 (Within Weeks)   BMI 23.27 kg/m     General appearance: alert, cooperative and appears stated age Head: Normocephalic, without obvious abnormality, atraumatic Neck: no adenopathy, supple, symmetrical, trachea midline and thyroid normal to inspection and palpation Lungs: clear to auscultation bilaterally Heart: regular rate and rhythm Abdomen: Pfannenstiel incision, soft, non-tender, no masses,  no organomegaly Extremities: extremities normal, atraumatic,  no cyanosis or edema Skin: Skin color, texture, turgor normal. No rashes or lesions Lymph nodes: Cervical, supraclavicular, and axillary nodes normal. No abnormal inguinal nodes palpated Neurologic: Grossly normal  Pelvic: External genitalia:  no lesions              Urethra:  normal appearing urethra with no masses, tenderness or lesions              Bartholins and Skenes: normal                 Vagina: normal appearing vagina with normal color and discharge, no lesions              Cervix: no lesions                Bimanual Exam:  Uterus:  normal size, contour, position, consistency, mobility, non-tender              Adnexa: no mass, fullness, tenderness         Chaperone was present for exam.  ASSESSMENT  Postmenopausal  bleeding,  Endometrial mass.  Hx LEEP.   PLAN  Proceed with hysteroscopy with dilation and curettage and Myosure resection of endometrial mass.  Risks, benefits, and alternatives discussed with the patient who wishes to proceed.  Surgical expectations and recovery discussed.    An After Visit Summary was printed and given to the patient.

## 2017-04-11 ENCOUNTER — Encounter (HOSPITAL_COMMUNITY)
Admission: RE | Admit: 2017-04-11 | Discharge: 2017-04-11 | Disposition: A | Discharge status: Home / Self Care | Payer: PRIVATE HEALTH INSURANCE | Source: Ambulatory Visit | Attending: Obstetrics and Gynecology | Admitting: Obstetrics and Gynecology

## 2017-04-11 ENCOUNTER — Encounter (HOSPITAL_COMMUNITY): Payer: Self-pay

## 2017-04-11 DIAGNOSIS — Z79899 Other long term (current) drug therapy: Secondary | ICD-10-CM | POA: Diagnosis not present

## 2017-04-11 DIAGNOSIS — E785 Hyperlipidemia, unspecified: Secondary | ICD-10-CM

## 2017-04-11 DIAGNOSIS — N870 Mild cervical dysplasia: Secondary | ICD-10-CM | POA: Insufficient documentation

## 2017-04-11 DIAGNOSIS — Z881 Allergy status to other antibiotic agents status: Secondary | ICD-10-CM | POA: Diagnosis not present

## 2017-04-11 DIAGNOSIS — Z01812 Encounter for preprocedural laboratory examination: Secondary | ICD-10-CM | POA: Insufficient documentation

## 2017-04-11 DIAGNOSIS — N950 Postmenopausal bleeding: Secondary | ICD-10-CM | POA: Diagnosis present

## 2017-04-11 DIAGNOSIS — Z8249 Family history of ischemic heart disease and other diseases of the circulatory system: Secondary | ICD-10-CM | POA: Diagnosis not present

## 2017-04-11 DIAGNOSIS — Z833 Family history of diabetes mellitus: Secondary | ICD-10-CM | POA: Diagnosis not present

## 2017-04-11 DIAGNOSIS — N840 Polyp of corpus uteri: Secondary | ICD-10-CM | POA: Diagnosis not present

## 2017-04-11 HISTORY — DX: Other seasonal allergic rhinitis: J30.2

## 2017-04-11 HISTORY — DX: Hyperlipidemia, unspecified: E78.5

## 2017-04-11 LAB — CBC
HCT: 40 % (ref 36.0–46.0)
HEMOGLOBIN: 13.9 g/dL (ref 12.0–15.0)
MCH: 30.9 pg (ref 26.0–34.0)
MCHC: 34.8 g/dL (ref 30.0–36.0)
MCV: 88.9 fL (ref 78.0–100.0)
PLATELETS: 212 10*3/uL (ref 150–400)
RBC: 4.5 MIL/uL (ref 3.87–5.11)
RDW: 13 % (ref 11.5–15.5)
WBC: 6.1 10*3/uL (ref 4.0–10.5)

## 2017-04-11 NOTE — Patient Instructions (Addendum)
Your procedure is scheduled on: Monday, April 23  Enter through the Main Entrance of [facility] at: 6 am  Pick up the phone at the desk and dial [phone].  Call this number if you have problems the morning of surgery: [phone].  Remember: Do NOT eat or drink (including water) after midnight Sunday. Take these medicines the morning of surgery with a SIP OF WATER: None  Do NOT wear jewelry (body piercing), metal hair clips/bobby pins, make-up, or nail polish. Do NOT wear lotions, powders, or perfumes.  You may wear deoderant. Do NOT shave for 48 hours prior to surgery. Do NOT bring valuables to the hospital. Contacts may not be worn into surgery. . Have a responsible adult drive you home and stay with you for 24 hours after your procedure.  Home with husband Rickie cell [phone]

## 2017-04-13 NOTE — H&P (Signed)
Office Visit   04/03/2017 [facility]  Patton Salles, MD  Obstetrics and Gynecology   Postmenopausal bleeding +1 more  Dx   Surgical Consult ; Referred by Benita Stabile, MD  Reason for Visit   Additional Documentation   Vitals:   BP 136/74 (BP Location: Right Arm, Patient Position: Sitting, Cuff Size: Normal)   Pulse 76   Ht 5' 4.25" (1.632 m)   Wt 136 lb 9.6 oz (62 kg)   LMP 03/24/2015 (Within Weeks)   BMI 23.27 kg/m   BSA 1.68 m      More Vitals   Flowsheets:   Custom Formula Data,   MEWS Score,   Anthropometrics,   Infectious Disease Screening     Encounter Info:   Billing Info,   History,   Allergies,   Detailed Report     All Notes   Progress Notes by Patton Salles, MD at 04/03/2017 2:30 PM   Author: Patton Salles, MD Author Type: Physician Filed: 04/03/2017 3:26 PM  Note Status: Signed Cosign: Cosign Not Required Encounter Date: 04/03/2017  Editor: Patton Salles, MD (Physician)  Prior Versions: 1. Patton Salles, MD (Physician) at 04/03/2017 3:21 PM - Sign at close encounter   2. Alphonsa Overall, CMA (Certified Medical Assistant) at 04/03/2017 2:37 PM - Sign at close encounter    GYNECOLOGY  VISIT   HPI: 56 y.o.   Married  Caucasian  female   G2P0002 with Patient's last menstrual period was 03/24/2015 (within weeks).   here for surgical consult.    Had postmenopausal bleeding.   Pelvic ultrasound and sonohysterogram 03/20/17 showing endometrial mass measuring 11 mm. EMB 03/12/17 - difficulty to evaluate for hyperplasia.  Signs of potential proliferation noted.   Bleeding stopped after one week.   FSH 76.1 on 08/07/15.  Not on HRT.  GYNECOLOGIC HISTORY: Patient's last menstrual period was 03/24/2015 (within weeks). Contraception:  Vasectomy/Postmenopausal Menopausal hormone therapy:  none Last mammogram: 01-22-17 Density B/Neg/BiRads1:TBC Last pap smear: 08-12-16 Neg:neg HR  HPV;07-26-14 Neg:Neg HR HPV. 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                OB History    Gravida Para Term Preterm AB Living   2 2 0 0 0 2   SAB TAB Ectopic Multiple Live Births   0 0 0 0 2         There are no active problems to display for this patient.       Past Medical History:  Diagnosis Date  . Abnormal Pap smear 2006   CIN I         Past Surgical History:  Procedure Laterality Date  . CESAREAN SECTION  03/1992  . LEEP  2006   pathology shows CIN I with HPV effect, no squamous intraepitheal lesion  . VAGINAL DELIVERY  12/1989          Current Outpatient Prescriptions  Medication Sig Dispense Refill  . Krill Oil 500 MG CAPS Take 1 capsule by mouth daily.    . Multiple Vitamin (MULTIVITAMIN) tablet Take 1 tablet by mouth daily.     No current facility-administered medications for this visit.      ALLERGIES: Tetracyclines & related       Family History  Problem Relation Age of Onset  . Hypertension Mother   . Hypertension Father   . Diabetes Father   . Hyperlipidemia  Father   . Obesity Brother   . Obesity Sister   . Hypertension Sister   . Hypertension Sister   . Obesity Sister   . Heart failure Paternal Aunt   . Heart failure Paternal Grandfather     Social History        Social History  . Marital status: Married    Spouse name: N/A  . Number of children: 2  . Years of education: N/A      Occupational History  . Not on file.         Social History Main Topics  . Smoking status: Never Smoker  . Smokeless tobacco: Never Used  . Alcohol use No  . Drug use: No  . Sexual activity: Yes    Partners: Male    Birth control/ protection: Surgical     Comment: husband vasectomy       Other Topics Concern  . Not on file      Social History Narrative  . No narrative on file    ROS:  Pertinent items are noted in HPI.  PHYSICAL EXAMINATION:    BP 136/74 (BP Location: Right Arm, Patient  Position: Sitting, Cuff Size: Normal)   Pulse 76   Ht 5' 4.25" (1.632 m)   Wt 136 lb 9.6 oz (62 kg)   LMP 03/24/2015 (Within Weeks)   BMI 23.27 kg/m     General appearance: alert, cooperative and appears stated age Head: Normocephalic, without obvious abnormality, atraumatic Neck: no adenopathy, supple, symmetrical, trachea midline and thyroid normal to inspection and palpation Lungs: clear to auscultation bilaterally Heart: regular rate and rhythm Abdomen: Pfannenstiel incision, soft, non-tender, no masses,  no organomegaly Extremities: extremities normal, atraumatic, no cyanosis or edema Skin: Skin color, texture, turgor normal. No rashes or lesions Lymph nodes: Cervical, supraclavicular, and axillary nodes normal. No abnormal inguinal nodes palpated Neurologic: Grossly normal  Pelvic: External genitalia:  no lesions              Urethra:  normal appearing urethra with no masses, tenderness or lesions              Bartholins and Skenes: normal                 Vagina: normal appearing vagina with normal color and discharge, no lesions              Cervix: no lesions                Bimanual Exam:  Uterus:  normal size, contour, position, consistency, mobility, non-tender              Adnexa: no mass, fullness, tenderness         Chaperone was present for exam.  ASSESSMENT  Postmenopausal bleeding,  Endometrial mass.  Hx LEEP.   PLAN  Proceed with hysteroscopy with dilation and curettage and Myosure resection of endometrial mass.  Risks, benefits, and alternatives discussed with the patient who wishes to proceed.  Surgical expectations and recovery discussed.    An After Visit Summary was printed and given to the patient.

## 2017-04-13 NOTE — Anesthesia Preprocedure Evaluation (Addendum)
Anesthesia Evaluation  Patient identified by MRN, date of birth, ID band Patient awake    Reviewed: Allergy & Precautions, NPO status , Patient's Chart, lab work & pertinent test results  History of Anesthesia Complications Negative for: history of anesthetic complications  Airway Mallampati: II  TM Distance: >3 FB Neck ROM: Full    Dental no notable dental hx. (+) Dental Advisory Given   Pulmonary neg pulmonary ROS,    Pulmonary exam normal        Cardiovascular negative cardio ROS Normal cardiovascular exam     Neuro/Psych negative neurological ROS     GI/Hepatic negative GI ROS, Neg liver ROS,   Endo/Other  negative endocrine ROS  Renal/GU negative Renal ROS     Musculoskeletal negative musculoskeletal ROS (+)   Abdominal   Peds  Hematology negative hematology ROS (+)   Anesthesia Other Findings Day of surgery medications reviewed with the patient.  Reproductive/Obstetrics                            Anesthesia Physical Anesthesia Plan  ASA: II  Anesthesia Plan: General   Post-op Pain Management:    Induction: Intravenous  Airway Management Planned: LMA and Oral ETT  Additional Equipment:   Intra-op Plan:   Post-operative Plan: Extubation in OR  Informed Consent: I have reviewed the patients History and Physical, chart, labs and discussed the procedure including the risks, benefits and alternatives for the proposed anesthesia with the patient or authorized representative who has indicated his/her understanding and acceptance.   Dental advisory given  Plan Discussed with: Anesthesiologist  Anesthesia Plan Comments:        Anesthesia Quick Evaluation

## 2017-04-14 ENCOUNTER — Encounter (HOSPITAL_COMMUNITY): Payer: Self-pay | Admitting: *Deleted

## 2017-04-14 ENCOUNTER — Encounter (HOSPITAL_COMMUNITY)
Admission: RE | Disposition: A | Discharge status: Home / Self Care | Payer: Self-pay | Source: Ambulatory Visit | Attending: Obstetrics and Gynecology

## 2017-04-14 ENCOUNTER — Ambulatory Visit (HOSPITAL_COMMUNITY)
Admission: RE | Admit: 2017-04-14 | Discharge: 2017-04-14 | Disposition: A | Discharge status: Home / Self Care | Payer: PRIVATE HEALTH INSURANCE | Source: Ambulatory Visit | Attending: Obstetrics and Gynecology | Admitting: Obstetrics and Gynecology

## 2017-04-14 ENCOUNTER — Ambulatory Visit (HOSPITAL_COMMUNITY): Payer: PRIVATE HEALTH INSURANCE | Admitting: Anesthesiology

## 2017-04-14 DIAGNOSIS — N950 Postmenopausal bleeding: Secondary | ICD-10-CM | POA: Insufficient documentation

## 2017-04-14 DIAGNOSIS — Z79899 Other long term (current) drug therapy: Secondary | ICD-10-CM | POA: Insufficient documentation

## 2017-04-14 DIAGNOSIS — N840 Polyp of corpus uteri: Secondary | ICD-10-CM | POA: Insufficient documentation

## 2017-04-14 DIAGNOSIS — Z833 Family history of diabetes mellitus: Secondary | ICD-10-CM | POA: Insufficient documentation

## 2017-04-14 DIAGNOSIS — N8501 Benign endometrial hyperplasia: Secondary | ICD-10-CM | POA: Diagnosis not present

## 2017-04-14 DIAGNOSIS — Z8249 Family history of ischemic heart disease and other diseases of the circulatory system: Secondary | ICD-10-CM | POA: Insufficient documentation

## 2017-04-14 DIAGNOSIS — Z881 Allergy status to other antibiotic agents status: Secondary | ICD-10-CM | POA: Insufficient documentation

## 2017-04-14 HISTORY — PX: DILATATION & CURETTAGE/HYSTEROSCOPY WITH MYOSURE: SHX6511

## 2017-04-14 SURGERY — DILATATION & CURETTAGE/HYSTEROSCOPY WITH MYOSURE
Anesthesia: General | Site: Vagina

## 2017-04-14 MED ORDER — ONDANSETRON HCL 4 MG/2ML IJ SOLN
INTRAMUSCULAR | Status: DC | PRN
  Administered 2017-04-14: 4 mg via INTRAVENOUS

## 2017-04-14 MED ORDER — LIDOCAINE HCL 1 % IJ SOLN
INTRAMUSCULAR | Status: AC
  Filled 2017-04-14: qty 20

## 2017-04-14 MED ORDER — SODIUM CHLORIDE 0.9 % IR SOLN
Status: DC | PRN
  Administered 2017-04-14: 3000 mL

## 2017-04-14 MED ORDER — MIDAZOLAM HCL 2 MG/2ML IJ SOLN
INTRAMUSCULAR | Status: AC
  Filled 2017-04-14: qty 2

## 2017-04-14 MED ORDER — FENTANYL CITRATE (PF) 100 MCG/2ML IJ SOLN
INTRAMUSCULAR | Status: DC | PRN
  Administered 2017-04-14 (×2): 50 ug via INTRAVENOUS

## 2017-04-14 MED ORDER — MIDAZOLAM HCL 2 MG/2ML IJ SOLN
INTRAMUSCULAR | Status: DC | PRN
  Administered 2017-04-14: 2 mg via INTRAVENOUS

## 2017-04-14 MED ORDER — KETOROLAC TROMETHAMINE 30 MG/ML IJ SOLN
INTRAMUSCULAR | Status: DC | PRN
  Administered 2017-04-14: 30 mg via INTRAVENOUS

## 2017-04-14 MED ORDER — HYDROCORTISONE NA SUCCINATE PF 100 MG IJ SOLR
100.0000 mg | Freq: Once | INTRAMUSCULAR | Status: DC
  Filled 2017-04-14: qty 2

## 2017-04-14 MED ORDER — IBUPROFEN 800 MG PO TABS: 800.0000 mg | ORAL_TABLET | Freq: Three times a day (TID) | ORAL | 0 refills | Status: DC | PRN

## 2017-04-14 MED ORDER — FENTANYL CITRATE (PF) 100 MCG/2ML IJ SOLN
INTRAMUSCULAR | Status: AC
  Filled 2017-04-14: qty 2

## 2017-04-14 MED ORDER — PHENYLEPHRINE HCL 10 MG/ML IJ SOLN
INTRAMUSCULAR | Status: DC | PRN
Start: 2017-04-14 — End: 2017-04-14
  Administered 2017-04-14: 80 ug via INTRAVENOUS
  Administered 2017-04-14: 40 ug via INTRAVENOUS
  Administered 2017-04-14: 80 ug via INTRAVENOUS

## 2017-04-14 MED ORDER — KETOROLAC TROMETHAMINE 30 MG/ML IJ SOLN
INTRAMUSCULAR | Status: AC
  Filled 2017-04-14: qty 1

## 2017-04-14 MED ORDER — LIDOCAINE HCL (CARDIAC) 20 MG/ML IV SOLN
INTRAVENOUS | Status: AC
  Filled 2017-04-14: qty 5

## 2017-04-14 MED ORDER — PROPOFOL 10 MG/ML IV BOLUS
INTRAVENOUS | Status: DC | PRN
  Administered 2017-04-14: 160 mg via INTRAVENOUS

## 2017-04-14 MED ORDER — DEXAMETHASONE SODIUM PHOSPHATE 4 MG/ML IJ SOLN
INTRAMUSCULAR | Status: AC
  Filled 2017-04-14: qty 1

## 2017-04-14 MED ORDER — ONDANSETRON HCL 4 MG/2ML IJ SOLN
INTRAMUSCULAR | Status: AC
  Filled 2017-04-14: qty 2

## 2017-04-14 MED ORDER — PROPOFOL 10 MG/ML IV BOLUS
INTRAVENOUS | Status: AC
  Filled 2017-04-14: qty 20

## 2017-04-14 MED ORDER — SCOPOLAMINE 1 MG/3DAYS TD PT72
MEDICATED_PATCH | TRANSDERMAL | Status: AC
  Administered 2017-04-14: 1.5 mg via TRANSDERMAL
  Filled 2017-04-14: qty 1

## 2017-04-14 MED ORDER — PHENYLEPHRINE 40 MCG/ML (10ML) SYRINGE FOR IV PUSH (FOR BLOOD PRESSURE SUPPORT)
PREFILLED_SYRINGE | INTRAVENOUS | Status: AC
  Filled 2017-04-14: qty 10

## 2017-04-14 MED ORDER — GLYCOPYRROLATE 0.2 MG/ML IJ SOLN
INTRAMUSCULAR | Status: AC
  Filled 2017-04-14: qty 1

## 2017-04-14 MED ORDER — DEXAMETHASONE SODIUM PHOSPHATE 10 MG/ML IJ SOLN
INTRAMUSCULAR | Status: DC | PRN
  Administered 2017-04-14: 4 mg via INTRAVENOUS

## 2017-04-14 MED ORDER — LACTATED RINGERS IV SOLN
INTRAVENOUS | Status: DC
  Administered 2017-04-14 (×2): via INTRAVENOUS

## 2017-04-14 MED ORDER — HYDROMORPHONE HCL 1 MG/ML IJ SOLN: 0.2500 mg | INTRAMUSCULAR | Status: DC | PRN

## 2017-04-14 MED ORDER — GLYCOPYRROLATE 0.2 MG/ML IJ SOLN
INTRAMUSCULAR | Status: DC | PRN
  Administered 2017-04-14 (×2): 0.1 mg via INTRAVENOUS

## 2017-04-14 MED ORDER — LIDOCAINE HCL (CARDIAC) 20 MG/ML IV SOLN
INTRAVENOUS | Status: DC | PRN
  Administered 2017-04-14: 40 mg via INTRAVENOUS

## 2017-04-14 MED ORDER — SCOPOLAMINE 1 MG/3DAYS TD PT72
1.0000 | MEDICATED_PATCH | Freq: Once | TRANSDERMAL | Status: DC
  Administered 2017-04-14: 1.5 mg via TRANSDERMAL

## 2017-04-14 MED ORDER — PROMETHAZINE HCL 25 MG/ML IJ SOLN: 6.2500 mg | INTRAMUSCULAR | Status: DC | PRN

## 2017-04-14 MED ORDER — LIDOCAINE HCL 1 % IJ SOLN
INTRAMUSCULAR | Status: DC | PRN
  Administered 2017-04-14: 10 mL

## 2017-04-14 SURGICAL SUPPLY — 21 items
CANISTER SUCT 3000ML PPV (MISCELLANEOUS) ×3 IMPLANT
CATH ROBINSON RED A/P 16FR (CATHETERS) ×3 IMPLANT
CLOTH BEACON ORANGE TIMEOUT ST (SAFETY) ×3 IMPLANT
CONTAINER PREFILL 10% NBF 60ML (FORM) ×6 IMPLANT
DEVICE MYOSURE LITE (MISCELLANEOUS) ×3 IMPLANT
DEVICE MYOSURE REACH (MISCELLANEOUS) IMPLANT
DILATOR CANAL MILEX (MISCELLANEOUS) ×3 IMPLANT
ELECT REM PT RETURN 9FT ADLT (ELECTROSURGICAL)
ELECTRODE REM PT RTRN 9FT ADLT (ELECTROSURGICAL) IMPLANT
FILTER ARTHROSCOPY CONVERTOR (FILTER) ×3 IMPLANT
GLOVE BIO SURGEON STRL SZ 6.5 (GLOVE) ×2 IMPLANT
GLOVE BIO SURGEONS STRL SZ 6.5 (GLOVE) ×1
GLOVE BIOGEL PI IND STRL 7.0 (GLOVE) ×2 IMPLANT
GLOVE BIOGEL PI INDICATOR 7.0 (GLOVE) ×4
GOWN STRL REUS W/TWL LRG LVL3 (GOWN DISPOSABLE) ×6 IMPLANT
PACK VAGINAL MINOR WOMEN LF (CUSTOM PROCEDURE TRAY) ×3 IMPLANT
PAD OB MATERNITY 4.3X12.25 (PERSONAL CARE ITEMS) ×3 IMPLANT
SEAL ROD LENS SCOPE MYOSURE (ABLATOR) ×3 IMPLANT
TOWEL OR 17X24 6PK STRL BLUE (TOWEL DISPOSABLE) ×6 IMPLANT
TUBING AQUILEX INFLOW (TUBING) ×3 IMPLANT
TUBING AQUILEX OUTFLOW (TUBING) ×3 IMPLANT

## 2017-04-14 NOTE — Transfer of Care (Signed)
Immediate Anesthesia Transfer of Care Note  Patient: Zoe Sherman  Procedure(s) Performed: Procedure(s) with comments: DILATATION & CURETTAGE/HYSTEROSCOPY WITH MYOSURE Polypectomy (N/A) - possible endometrial polyp  Patient Location: PACU  Anesthesia Type:General  Level of Consciousness: awake, alert  and oriented  Airway & Oxygen Therapy: Patient Spontanous Breathing and Patient connected to nasal cannula oxygen  Post-op Assessment: Report given to RN, Post -op Vital signs reviewed and stable and Patient moving all extremities X 4  Post vital signs: Reviewed and stable  Last Vitals:  Vitals:   04/14/17 0610  BP: (!) 158/85  Pulse: 65  Resp: 16  Temp: 36.9 C    Last Pain:  Vitals:   04/14/17 0610  TempSrc: Oral  PainSc: 0-No pain      Patients Stated Pain Goal: 5 (04/14/17 0610)  Complications: No apparent anesthesia complications

## 2017-04-14 NOTE — Op Note (Signed)
OPERATIVE REPORT   PREOPERATIVE DIAGNOSES:  Postmenopausal bleeding, endometrial mass.  POSTOPERATIVE DIAGNOSES:   Postmenopausal bleeding, endometrial mass.  PROCEDURE:  Hysteroscopy with dilation and curettage and Myosure resection of endometrial polyp.  SURGEON:  Randye Lobo, MD  ANESTHESIA:  LMA, paracervical block with 10 mL of 1% lidocaine.  IV FLUIDS:  1000 cc.  EBL:  5 cc.  URINE OUTPUT: 200 cc.  NORMAL SALINE DEFICIT:   45 cc.  COMPLICATIONS:  None.  INDICATIONS FOR THE PROCEDURE:     The patient is a 56 year old P2 Caucasian female who presents with post menopausal bleeding.  sonohysterogram showed an endometrial mass, and EMB showed proliferation.  A plan is now made to proceed with a hysteroscopy with dilation and curettage and Myosure resection of endometrial mass, after risks, benefits and alternatives were reviewed.  FINDINGS:  Exam under anesthesia revealed a small, anteverted mobile uterus. The uterus was sounded to 7 cm. Hysteroscopy showed a 1 cm polyp attached to the right uterine fundal sidewall. Endometrial currettings were scant.  SPECIMENS:  The endometrial polyp and endometrial curettings were sent to Pathology.  PROCEDURE IN DETAIL:  The patient was reidentified in the preoperative hold area.  She received TED hose and PAS stockings for DVT prophylaxis.  In the operating room, the patient was placed in the dorsal lithotomy position and then an LMA anesthetic was introduced.  The patient's lower abdomen, vagina and perineum were sterilely prepped with Betadine and the  patient's bladder was catheterized of urine.  She was sterilly draped  An exam under anesthesia was performed.  A speculum was placed inside the vagina and a single-tooth tenaculum was placed on the anterior cervical lip.  A paracervical block was performed with a total of 10 mL of 1% lidocaine plain.  The cervix was opened with an os finder.  The uterus was then sounded.  The cervix was dilated to a #25 Pratt  dilator.  The MyoSure hysteroscope was then inserted inside the uterine cavity  under the continuous infusion of normal saline solution.  Findings are as noted  above.  The Myosure was used to resect the endometrial polyp, and the specimen was sent to Pathology. The MyoSure hysteroscope was removed.  The sharp and then a  serrated curette were introduced into the uterine cavity and the endometrium  was curetted in all 4 quadrants.  A minimal amount of endometrial curettings  was obtained. This specimen was sent to Pathology.  The single-tooth tenaculum which had been placed on the anterior cervical lip was removed.  Hemostasis was good, and all of the vaginal  instruments were removed.  The patient was awakened and escorted to the recovery room in stable condition after she was cleansed with Betadine.  There were no complications to the procedure.  All needle, instrument and sponge counts were correct.  Randye Lobo, MD

## 2017-04-14 NOTE — Anesthesia Procedure Notes (Signed)
Procedure Name: LMA Insertion Date/Time: 04/14/2017 7:26 AM Performed by: Yolonda Kida Pre-anesthesia Checklist: Patient identified, Emergency Drugs available, Suction available and Patient being monitored Patient Re-evaluated:Patient Re-evaluated prior to inductionOxygen Delivery Method: Circle system utilized Preoxygenation: Pre-oxygenation with 100% oxygen Intubation Type: IV induction Ventilation: Mask ventilation without difficulty LMA: LMA inserted LMA Size: 4.0 Number of attempts: 1 Placement Confirmation: positive ETCO2,  CO2 detector and breath sounds checked- equal and bilateral Tube secured with: Tape Dental Injury: Teeth and Oropharynx as per pre-operative assessment

## 2017-04-14 NOTE — Progress Notes (Signed)
Update to History and Physical  No marked change in status since office preop visit.   Patient examined.   Ok to proceed.  

## 2017-04-14 NOTE — Brief Op Note (Signed)
04/14/2017  7:59 AM  PATIENT:  Zoe Sherman  56 y.o. female  PRE-OPERATIVE DIAGNOSIS:  PMB, endometrial mass  POST-OPERATIVE DIAGNOSIS:  PMB, endometrial mass  PROCEDURE:  Procedure(s) with comments: DILATATION & CURETTAGE/HYSTEROSCOPY WITH MYOSURE Polypectomy (N/A) - possible endometrial polyp  SURGEON:  Surgeon(s) and Role:    * Reed Eifert E Ardell Isaacs, MD - Primary  PHYSICIAN ASSISTANT: NA  ASSISTANTS: none   ANESTHESIA:   paracervical block, LMA.  EBL:  Total I/O In: 1000 [I.V.:1000] Out: 205 [Urine:200; Blood:5]  BLOOD ADMINISTERED:none  DRAINS: none   LOCAL MEDICATIONS USED:  LIDOCAINE  and Amount: 10 ml  SPECIMEN:  Source of Specimen:  Endometrial polyp and endometrial curettings.  DISPOSITION OF SPECIMEN:  PATHOLOGY  COUNTS:  YES  TOURNIQUET:  * No tourniquets in log *  DICTATION: .Note written in EPIC  PLAN OF CARE: Discharge to home after PACU  PATIENT DISPOSITION:  PACU - hemodynamically stable.   Delay start of Pharmacological VTE agent (>24hrs) due to surgical blood loss or risk of bleeding: not applicable

## 2017-04-14 NOTE — Anesthesia Postprocedure Evaluation (Addendum)
Anesthesia Post Note  Patient: Zoe Sherman  Procedure(s) Performed: Procedure(s) (LRB): DILATATION & CURETTAGE/HYSTEROSCOPY WITH MYOSURE Polypectomy (N/A)  Patient location during evaluation: PACU Anesthesia Type: General Level of consciousness: sedated Pain management: pain level controlled Vital Signs Assessment: post-procedure vital signs reviewed and stable Respiratory status: spontaneous breathing and respiratory function stable Cardiovascular status: stable Anesthetic complications: no        Last Vitals:  Vitals:   04/14/17 0900 04/14/17 0905  BP: 119/76   Pulse: 65 63  Resp: 12 12  Temp: 36.5 C     Last Pain:  Vitals:   04/14/17 0900  TempSrc:   PainSc: 1    Pain Goal: Patients Stated Pain Goal: 5 (04/14/17 0900)               Vivyan Biggers DANIEL

## 2017-04-14 NOTE — Discharge Instructions (Signed)
[name],  I found and removed a polyp from the uterine cavity.  I also did the curettage of the uterine walls.  Everything will be sent for analysis in the lab.  I will let you know when I get reports back.   Please call if you need anything!  Conley Simmonds, MD   Hysteroscopy, Care After Refer to this sheet in the next few weeks. These instructions provide you with information on caring for yourself after your procedure. Your health care provider may also give you more specific instructions. Your treatment has been planned according to current medical practices, but problems sometimes occur. Call your health care provider if you have any problems or questions after your procedure. What can I expect after the procedure? After your procedure, it is typical to have the following:  You may have some cramping. This normally lasts for a couple days.  You may have bleeding. This can vary from light spotting for a few days to menstrual-like bleeding for 3-7 days. Follow these instructions at home:  Rest for the first 1-2 days after the procedure.  Only take over-the-counter or prescription medicines as directed by your health care provider. Do not take aspirin. It can increase the chances of bleeding.  Take showers instead of baths for 2 weeks or as directed by your health care provider.  Do not drive for 24 hours or as directed.  Do not drink alcohol while taking pain medicine.  Do not use tampons, douche, or have sexual intercourse for 2 weeks or until your health care provider says it is okay.  Take your temperature twice a day for 4-5 days. Write it down each time.  Follow your health care provider's advice about diet, exercise, and lifting.  If you develop constipation, you may:  Take a mild laxative if your health care provider approves.  Add bran foods to your diet.  Drink enough fluids to keep your urine clear or pale yellow.  Try to have someone with you or available to you  for the first 24-48 hours, especially if you were given a general anesthetic.  Follow up with your health care provider as directed. Contact a health care provider if:  You feel dizzy or lightheaded.  You feel sick to your stomach (nauseous).  You have abnormal vaginal discharge.  You have a rash.  You have pain that is not controlled with medicine. Get help right away if:  You have bleeding that is heavier than a normal menstrual period.  You have a fever.  You have increasing cramps or pain, not controlled with medicine.  You have new belly (abdominal) pain.  You pass out.  You have pain in the tops of your shoulders (shoulder strap areas).  You have shortness of breath. This information is not intended to replace advice given to you by your health care provider. Make sure you discuss any questions you have with your health care provider. Document Released: 09/29/2013 Document Revised: 05/16/2016 Document Reviewed: 07/08/2013 Elsevier Interactive Patient Education  2017 [employer]   Dilation and Curettage or Vacuum Curettage, Care After This sheet gives you information about how to care for yourself after your procedure. Your health care provider may also give you more specific instructions. If you have problems or questions, contact your health care provider. What can I expect after the procedure? After your procedure, it is common to have:  Mild pain or cramping.  Some vaginal bleeding or spotting. These may last for up to  2 weeks after your procedure. Follow these instructions at home: Activity    Do not drive or use heavy machinery while taking prescription pain medicine.  Avoid driving for the first 24 hours after your procedure.  Take frequent, short walks, followed by rest periods, throughout the day. Ask your health care provider what activities are safe for you. After 1-2 days, you may be able to return to your normal activities.  Do not lift  anything heavier than 10 lb (4.5 kg) until your health care provider approves.  For at least 2 weeks, or as long as told by your health care provider, do not:  Douche.  Use tampons.  Have sexual intercourse. General instructions    Take over-the-counter and prescription medicines only as told by your health care provider. This is especially important if you take blood thinning medicine.  Do not take baths, swim, or use a hot tub until your health care provider approves. Take showers instead of baths.  Wear compression stockings as told by your health care provider. These stockings help to prevent blood clots and reduce swelling in your legs.  It is your responsibility to get the results of your procedure. Ask your health care provider, or the department performing the procedure, when your results will be ready.  Keep all follow-up visits as told by your health care provider. This is important. Contact a health care provider if:  You have severe cramps that get worse or that do not get better with medicine.  You have severe abdominal pain.  You cannot drink fluids without vomiting.  You develop pain in a different area of your pelvis.  You have bad-smelling vaginal discharge.  You have a rash. Get help right away if:  You have vaginal bleeding that soaks more than one sanitary pad in 1 hour, for 2 hours in a row.  You pass large blood clots from your vagina.  You have a fever that is above 100.48F (38.0C).  Your abdomen feels very tender or hard.  You have chest pain.  You have shortness of breath.  You cough up blood.  You feel dizzy or light-headed.  You faint.  You have pain in your neck or shoulder area. This information is not intended to replace advice given to you by your health care provider. Make sure you discuss any questions you have with your health care provider.     Post Anesthesia Home Care Instructions  Activity: Get plenty of rest for  the remainder of the day. A responsible individual must stay with you for 24 hours following the procedure.  For the next 24 hours, DO NOT: -Drive a car -Advertising copywriter -Drink alcoholic beverages -Take any medication unless instructed by your physician -Make any legal decisions or sign important papers.  Meals: Start with liquid foods such as gelatin or soup. Progress to regular foods as tolerated. Avoid greasy, spicy, heavy foods. If nausea and/or vomiting occur, drink only clear liquids until the nausea and/or vomiting subsides. Call your physician if vomiting continues.  Special Instructions/Symptoms: Your throat may feel dry or sore from the anesthesia or the breathing tube placed in your throat during surgery. If this causes discomfort, gargle with warm salt water. The discomfort should disappear within 24 hours.  If you had a scopolamine patch placed behind your ear for the management of post- operative nausea and/or vomiting:  1. The medication in the patch is effective for 72 hours, after which it should be removed.  Wrap patch  in a tissue and discard in the trash. Wash hands thoroughly with soap and water. 2. You may remove the patch earlier than 72 hours if you experience unpleasant side effects which may include dry mouth, dizziness or visual disturbances. 3. Avoid touching the patch. Wash your hands with soap and water after contact with the patch.

## 2017-04-15 ENCOUNTER — Encounter (HOSPITAL_COMMUNITY): Payer: Self-pay | Admitting: Obstetrics and Gynecology

## 2017-04-16 ENCOUNTER — Telehealth: Payer: Self-pay | Admitting: *Deleted

## 2017-04-16 NOTE — Telephone Encounter (Signed)
-----   Message from Patton Salles, MD sent at 04/15/2017  8:06 PM EDT ----- Please inform patient of her final pathology report showing simple and complex endometrial hyperplasia of the uterine lining.  There is no sign of cancer. This will require further treatment.  She will having options to do medical therapy with progesterone or choose hysterectomy.  OK if she wants to move up her post op appointment time to discuss sooner.   Cc- Claudette Laws

## 2017-04-16 NOTE — Telephone Encounter (Signed)
Left message to call Kathie Posa at [phone].  

## 2017-04-16 NOTE — Telephone Encounter (Signed)
Patient returning your call.

## 2017-04-16 NOTE — Telephone Encounter (Signed)
Ok for Motrin 800 mg po q 8 hours prn and heating pad.  Thank you for moving up the appointment.  You may close the encounter when you are ready.

## 2017-04-16 NOTE — Telephone Encounter (Signed)
Spoke with patient, advised of results and recommendations as seen below per Dr. Edward Jolly. Patients 5/7 appointment cancelled, moved to 04/21/17 at 3:30pm. Patient states she took motrin the afternoon of surgery, none on 4/24 and returned to work today. Patient states she is experiencing some mild menstrual like cramping, is it ok to take motrin? Patient reports no bleeding. Advised patient to take previously prescribed 800 mg Motrin q8 hours as needed, may also use heating pad for comfort. Advised patient would update Dr. Edward Jolly and return call with any additional recommendations. Patient verbalizes understanding and is agreeable.  Dr. Edward Jolly -any additional recommendations?

## 2017-04-21 ENCOUNTER — Encounter: Payer: Self-pay | Admitting: Obstetrics and Gynecology

## 2017-04-21 ENCOUNTER — Ambulatory Visit (INDEPENDENT_AMBULATORY_CARE_PROVIDER_SITE_OTHER): Payer: PRIVATE HEALTH INSURANCE | Admitting: Obstetrics and Gynecology

## 2017-04-21 VITALS — BP 124/66 | HR 76 | Resp 16 | Wt 134.0 lb

## 2017-04-21 DIAGNOSIS — N8501 Benign endometrial hyperplasia: Secondary | ICD-10-CM | POA: Diagnosis not present

## 2017-04-21 NOTE — Progress Notes (Signed)
GYNECOLOGY  VISIT   HPI: 56 y.o.   Married  Caucasian  female   [id number] with Patient's last menstrual period was 03/24/2015 (within weeks).   here for  Discuss path results/post op  DILATATION & CURETTAGE/HYSTEROSCOPY WITH MYOSURE Polypectomy (N/A Vagina ). Minor cramping and spotting after surgery was done.  Final pathology showed benign endometrial curettings and the polyp showed simple and complex hyperplasia without atypia.   Her pre-procedure endometrial biopsy showed proliferative endometrium and breakdown. No hyperplasia or malignancy noted.  Can loose a tiny amount of urine with jumping up and down.  No pad use.  No pelvic pressure. No problems with bowel movements.   GYNECOLOGIC HISTORY: Patient's last menstrual period was 03/24/2015 (within weeks). Contraception:  Vasectomy/Postmenopausal Menopausal hormone therapy:  none Last mammogram:  01-22-17 Density B/Neg/BiRads1:TBC Last pap smear:   08-12-16 Neg:neg HR HPV;07-26-14 Neg:Neg HR HPV. 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        OB History    Gravida Para Term Preterm AB Living   2 2 0 0 0 2   SAB TAB Ectopic Multiple Live Births   0 0 0 0 2         There are no active problems to display for this patient.   Past Medical History:  Diagnosis Date  . 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 Hyperlipidemia    tx with krill oil / diet  . Seasonal allergies     Past Surgical History:  Procedure Laterality Date  . CESAREAN SECTION  03/1992  . DILATATION & CURETTAGE/HYSTEROSCOPY WITH MYOSURE N/A 04/14/2017   Procedure: DILATATION & CURETTAGE/HYSTEROSCOPY WITH MYOSURE Polypectomy;  Surgeon: Patton Salles, MD;  Location: [facility];  Service: Gynecology;  Laterality: N/A;  possible endometrial polyp  . LEEP  2006   pathology shows CIN I with HPV effect, no squamous intraepitheal lesion  . TONSILLECTOMY    . VAGINAL DELIVERY  12/1989    Current Outpatient Prescriptions  Medication Sig Dispense Refill  . ibuprofen  (ADVIL,MOTRIN) 800 MG tablet Take 1 tablet (800 mg total) by mouth every 8 (eight) hours as needed. 30 tablet 0  . Krill Oil 500 MG CAPS Take 1 capsule by mouth daily.    . Multiple Vitamin (MULTIVITAMIN) tablet Take 1 tablet by mouth daily.     No current facility-administered medications for this visit.      ALLERGIES: Tetracyclines & related  Family History  Problem Relation Age of Onset  . Hypertension Mother   . Hypertension Father   . Diabetes Father   . Hyperlipidemia Father   . Obesity Brother   . Obesity Sister   . Hypertension Sister   . Hypertension Sister   . Obesity Sister   . Heart failure Paternal Aunt   . Heart failure Paternal Grandfather     Social History   Social History  . Marital status: Married    Spouse name: N/A  . Number of children: 2  . Years of education: N/A   Occupational History  . Not on file.   Social History Main Topics  . Smoking status: Never Smoker  . Smokeless tobacco: Never Used  . Alcohol use No  . Drug use: No  . Sexual activity: Yes    Partners: Male    Birth control/ protection: Surgical, Post-menopausal     Comment: husband vasectomy   Other Topics Concern  . Not on file   Social History Narrative  . No narrative  on file    ROS:  Pertinent items are noted in HPI.  PHYSICAL EXAMINATION:    BP 124/66 (BP Location: Right Arm, Patient Position: Sitting, Cuff Size: Normal)   Pulse 76   Resp 16   Wt 134 lb (60.8 kg)   LMP 03/24/2015 (Within Weeks)   BMI 23.00 kg/m     General appearance: alert, cooperative and appears stated age   Pelvic: External genitalia:  no lesions              Urethra:  normal appearing urethra with no masses, tenderness or lesions              Bartholins and Skenes: normal                 Vagina: normal appearing vagina with normal color and discharge, no lesions              Cervix: no lesions                Bimanual Exam:  Uterus:  normal size, contour, position, consistency,  mobility, non-tender              Adnexa: no mass, fullness, tenderness         Chaperone was present for exam.  ASSESSMENT  Complex endometrial hyperplasia without atypia.  PLAN  We discussed endometrial hyperplasia in a comprehensive fashion including risk factors, subtypes, risk or progression to endometrial cancer and treatment options.  We reviewed Up To Date together for her to see the date on this topic. We discussed Megace tx, Mirena IUD, and hysterectomy risks and benefits of each. HWe reviewed that her endometrial hyperplasia was not identified on the endometrial biopsy. We talked in detail about a laparoscopic hysterectomy with bilateral salpingectomy.   We discussed risks of bleeding, transfusion, risk of infection, potential damage to surrounding organs, pneumonia, DVT, PE, death, and need for reoperation.  If a cancer were to be found on post hysterectomy pathology review, she could need further surgery for staging.  ACOG handout given on hysterectomy.  She would like to proceed with surgery.  She can concerns about repeat biopsies and possible future unrecognized underlying pathology.    An After Visit Summary was printed and given to the patient.  _25____ minutes face to face time of which over 50% was spent in counseling.

## 2017-04-24 ENCOUNTER — Telehealth: Payer: Self-pay | Admitting: Obstetrics and Gynecology

## 2017-04-24 NOTE — Telephone Encounter (Signed)
Spoke with patient regarding benefit for recommended surgery. Patient understood information presented. Patients benefit plan year renewed on 04/22/17, patients states she feels the benefit information provided is not correct and would like to verify this information with her employer. Patient states she will call back to schedule, once she receives verification of this information from her employer.  Routing to [employer] Yeakley

## 2017-04-28 ENCOUNTER — Ambulatory Visit: Payer: PRIVATE HEALTH INSURANCE | Admitting: Obstetrics and Gynecology

## 2017-04-30 NOTE — Telephone Encounter (Signed)
Routing to Dr.Silva for review. 

## 2017-04-30 NOTE — Telephone Encounter (Signed)
I would contact patient back next week if she does not call sooner.  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

## 2017-04-30 NOTE — Telephone Encounter (Signed)
Called patient to patient for follow up in regards to benefits (see account notes for more details) Left a voicemail requesting a return call   cc: Billie RuddySally Yeakley

## 2017-04-30 NOTE — Telephone Encounter (Signed)
Patient returned call.  ° °Cc: Zoe Sherman °

## 2017-04-30 NOTE — Telephone Encounter (Signed)
Patient returned call. Patient still has concerns regarding her insurance plan renewal benefits effective 04/22/17, and wants to have these concerns with her insurance company and employer resolved before scheduling procedure. Patient advises she hopes to call us back on 05/01/17 to advise how she would like to proceed.   cc: Billie RuddySally Yeakley

## 2017-05-01 NOTE — Telephone Encounter (Signed)
Will close current encounter and contact patient next week.   CC: Harland DingwallSuzy Dixon

## 2017-05-09 ENCOUNTER — Telehealth: Payer: Self-pay | Admitting: Obstetrics and Gynecology

## 2017-05-09 NOTE — Telephone Encounter (Signed)
Call to patient to discuss surgery date options. Patient requesting date for June (which is not available) and is planning to travel to beach the week of 07-13-17.  Riding in car, 3-4 hours away.  Advised will need to review surgery date/recovery/travel dates with provider and call her back.  Patient states ok to leave message.

## 2017-05-09 NOTE — Telephone Encounter (Signed)
Patient called to confirmed she is ready to proceed with scheduling the recommended surgical procedure. Patient aware this is professional benefit only. Patient aware will be contacted by hospital for separate benefits. Forwarding to nurse supervisor for scheduling.  Routing to [employer]akley  cc: Dr Edward JollySilva

## 2017-05-09 NOTE — Telephone Encounter (Signed)
See previous phone notes. Spoke with patient regarding new benefit information, I received today from [employer] (see account notes for details).  Reviewed benefit, with new information, for recommended surgery. Patient understood information presented and is agreeable. Patient advises she will call back today to confirm surgery and move forward with scheduling.  Routing to Dr Edward JollySilva  cc: Billie RuddySally Yeakley

## 2017-05-09 NOTE — Telephone Encounter (Signed)
Thank you for the update!

## 2017-05-13 NOTE — Telephone Encounter (Signed)
Patient returning call. Ok to call home number [phone].

## 2017-05-13 NOTE — Telephone Encounter (Signed)
Reviewed surgery date options with Dr Edward JollySilva. Call back to patient to discuss options. Left message to call back.

## 2017-05-13 NOTE — Telephone Encounter (Signed)
Call to patient. Surgery date option of 06-23-17 discussed with patient and patient is agreeable. Surgery instruction sheet reviewed and printed copy will be mailed to patient with surgery center brochure. Pre and post operative appointments scheduled. Patient to call as needed.   Routing to provider for final review. Patient agreeable to disposition. Will close encounter.

## 2017-05-24 NOTE — Addendum Note (Signed)
Addendum  created 05/24/17 0837 by Alaia Lordi, MD   Sign clinical note    

## 2017-05-24 NOTE — Addendum Note (Signed)
Addendum  created 05/24/17 0836 by Javan Gonzaga, MD   Sign clinical note    

## 2017-06-02 ENCOUNTER — Ambulatory Visit (INDEPENDENT_AMBULATORY_CARE_PROVIDER_SITE_OTHER): Payer: PRIVATE HEALTH INSURANCE | Admitting: Obstetrics and Gynecology

## 2017-06-02 ENCOUNTER — Encounter: Payer: Self-pay | Admitting: Obstetrics and Gynecology

## 2017-06-02 VITALS — BP 118/70 | HR 76 | Resp 16 | Wt 135.0 lb

## 2017-06-02 DIAGNOSIS — Z01812 Encounter for preprocedural laboratory examination: Secondary | ICD-10-CM

## 2017-06-02 DIAGNOSIS — N8501 Benign endometrial hyperplasia: Secondary | ICD-10-CM

## 2017-06-02 NOTE — Progress Notes (Signed)
GYNECOLOGY  VISIT   HPI: 56 y.o.   Married  Caucasian  female   G2P0002 with Patient's last menstrual period was 03/24/2015 (within weeks).   here for discussion of surgery.   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  Her office EMB did not detect the hyperplasia.   Her ovaries are normal on pelvic ultrasound done 03/20/17.  No change in her health since her office visit on 04/21/17.  Just had lab work done on 05/07/17  for her wellness program.   GYNECOLOGIC HISTORY: Patient's last menstrual period was 03/24/2015 (within weeks). Contraception:  Vasectomy/Postmenopausal Menopausal hormone therapy:  none Last mammogram:  01-22-17 Density B/Neg/BiRads1:TBC Last pap smear:   08-12-16 Neg:neg HR HPV;07-26-14 Neg:Neg HR HPV. 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        OB History    Gravida Para Term Preterm AB Living   2 2 0 0 0 2   SAB TAB Ectopic Multiple Live Births   0 0 0 0 2         There are no active problems to display for this patient.   Past Medical History:  Diagnosis Date  . 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 Hyperlipidemia    tx with krill oil / diet  . Seasonal allergies     Past Surgical History:  Procedure Laterality Date  . CESAREAN SECTION  03/1992  . DILATATION & CURETTAGE/HYSTEROSCOPY WITH MYOSURE N/A 04/14/2017   Procedure: DILATATION & CURETTAGE/HYSTEROSCOPY WITH MYOSURE Polypectomy;  Surgeon: Patton Salles, MD;  Location: [facility];  Service: Gynecology;  Laterality: N/A;  possible endometrial polyp  . LEEP  2006   pathology shows CIN I with HPV effect, no squamous intraepitheal lesion  . TONSILLECTOMY    . VAGINAL DELIVERY  12/1989    Current Outpatient Prescriptions  Medication Sig Dispense Refill  . Multiple Vitamin (MULTIVITAMIN) tablet Take 1 tablet by mouth daily.    Marland Kitchen ibuprofen (ADVIL,MOTRIN) 800 MG tablet Take 1 tablet (800 mg total) by mouth every 8 (eight) hours as needed.  (Patient not taking: Reported on 06/02/2017) 30 tablet 0  . Krill Oil 500 MG CAPS Take 1 capsule by mouth daily.     No current facility-administered medications for this visit.      ALLERGIES: Tetracyclines & related  Family History  Problem Relation Age of Onset  . Hypertension Mother   . Hypertension Father   . Diabetes Father   . Hyperlipidemia Father   . Obesity Brother   . Obesity Sister   . Hypertension Sister   . Hypertension Sister   . Obesity Sister   . Heart failure Paternal Aunt   . Heart failure Paternal Grandfather     Social History   Social History  . Marital status: Married    Spouse name: N/A  . Number of children: 2  . Years of education: N/A   Occupational History  . Not on file.   Social History Main Topics  . Smoking status: Never Smoker  . Smokeless tobacco: Never Used  . Alcohol use No  . Drug use: No  . Sexual activity: Yes    Partners: Male    Birth control/ protection: Surgical, Post-menopausal     Comment: husband vasectomy   Other Topics Concern  . Not on file   Social History Narrative  . No narrative on file    ROS:  Pertinent items are noted  in HPI.  PHYSICAL EXAMINATION:    BP 118/70 (BP Location: Right Arm, Patient Position: Sitting, Cuff Size: Normal)   Pulse 76   Resp 16   Wt 135 lb (61.2 kg)   LMP 03/24/2015 (Within Weeks)   BMI 23.17 kg/m     General appearance: alert, cooperative and appears stated age Head: Normocephalic, without obvious abnormality, atraumatic Neck: no adenopathy, supple, symmetrical, trachea midline and thyroid normal to inspection and palpation Lungs: clear to auscultation bilaterally Heart: regular rate and rhythm Abdomen: soft, non-tender, no masses,  no organomegaly Extremities: extremities normal, atraumatic, no cyanosis or edema Skin: Skin color, texture, turgor normal. No rashes or lesions Lymph nodes: Cervical, supraclavicular, and axillary nodes normal. No abnormal inguinal nodes  palpated Neurologic: Grossly normal  Pelvic: External genitalia:  no lesions              Urethra:  normal appearing urethra with no masses, tenderness or lesions              Bartholins and Skenes: normal                 Vagina: normal appearing vagina with normal color and discharge, no lesions              Cervix: no lesions                Bimanual Exam:  Uterus:  normal size, contour, position, consistency, mobility, non-tender              Adnexa: no mass, fullness, tenderness              Chaperone was present for exam.  ASSESSMENT  Complex endometrial hyperplasia without atypia.  Hx Cesarean Section.  Remote hx LEEP for CIN I.  PLAN  Plan for total laparoscopic hysterectomy with bilateral salpingectomy and cystoscopy.  Risks, benefits, and alternatives discussed with patient who wishes to proceed.  Discussed laparotomy risk.  Surgical expectations and recovery discussed. No bowel prep needed.  She will receive Lovenox for DVT prophylaxis. CBC and CMP today.   An After Visit Summary was printed and given to the patient.

## 2017-06-03 LAB — COMPREHENSIVE METABOLIC PANEL
A/G RATIO: 2.2 (ref 1.2–2.2)
ALBUMIN: 4.6 g/dL (ref 3.5–5.5)
ALK PHOS: 90 IU/L (ref 39–117)
ALT: 16 IU/L (ref 0–32)
AST: 17 IU/L (ref 0–40)
BILIRUBIN TOTAL: 0.2 mg/dL (ref 0.0–1.2)
BUN / CREAT RATIO: 20 (ref 9–23)
BUN: 17 mg/dL (ref 6–24)
CHLORIDE: 103 mmol/L (ref 96–106)
CO2: 27 mmol/L (ref 20–29)
Calcium: 9.5 mg/dL (ref 8.7–10.2)
Creatinine, Ser: 0.87 mg/dL (ref 0.57–1.00)
GFR calc Af Amer: 88 mL/min/{1.73_m2} (ref >59)
GFR calc non Af Amer: 76 mL/min/{1.73_m2} (ref >59)
Globulin, Total: 2.1 g/dL (ref 1.5–4.5)
Glucose: 90 mg/dL (ref 65–99)
POTASSIUM: 4.4 mmol/L (ref 3.5–5.2)
SODIUM: 144 mmol/L (ref 134–144)
Total Protein: 6.7 g/dL (ref 6.0–8.5)

## 2017-06-03 LAB — CBC
HEMATOCRIT: 39.4 % (ref 34.0–46.6)
Hemoglobin: 13.3 g/dL (ref 11.1–15.9)
MCH: 29.8 pg (ref 26.6–33.0)
MCHC: 33.8 g/dL (ref 31.5–35.7)
MCV: 88 fL (ref 79–97)
PLATELETS: 226 10*3/uL (ref 150–379)
RBC: 4.46 x10E6/uL (ref 3.77–5.28)
RDW: 13.4 % (ref 12.3–15.4)
WBC: 5.3 10*3/uL (ref 3.4–10.8)

## 2017-06-16 NOTE — Progress Notes (Signed)
CBC, CMP 06-02-17 epic LOV Amundson 06-02-17 epic

## 2017-06-16 NOTE — Patient Instructions (Signed)
Aviva SignsDonna A Apachito  06/16/2017      Your procedure is scheduled on Monday 06-23-17.   [facility]  [facility]  [facility]  Call this number if you have problems the morning of surgery: [phone]             OUR ADDRESS IS [address] , [facility].    Remember:  Do not eat food or drink liquids after midnight.  Take these medicines the morning of surgery with A SIP OF WATER: NONE  Do not wear jewelry, make-up or nail polish.  Do not wear lotions, powders, or perfumes, or deoderant.  Do not shave 48 hours prior to surgery.    Do not bring valuables to the hospital.  [facility] is not responsible for any belongings or valuables.  Contacts, dentures or bridgework may not be worn into surgery.       Special instructions:  Patients discharged the day of surgery will not be allowed to drive home.  Please read over the following fact sheets that you were given.  [facility] - Preparing for Surgery Before surgery, you can play an important role.  Because skin is not sterile, your skin needs to be as free of germs as possible.  You can reduce the number of germs on your skin by washing with CHG (chlorahexidine gluconate) soap before surgery.  CHG is an antiseptic cleaner which kills germs and bonds with the skin to continue killing germs even after washing. Please DO NOT use if you have an allergy to CHG or antibacterial soaps.  If your skin becomes reddened/irritated stop using the CHG and inform your nurse when you arrive at Short Stay. Do not shave (including legs and underarms) for at least 48 hours prior to the first CHG shower.  You may shave your face/neck. Please follow these instructions carefully:  1.  Shower with CHG Soap the night before surgery and the  morning of Surgery.  2.  If you choose to wash your hair, wash your hair first as usual with your  normal  shampoo.  3.  After you shampoo, rinse your hair and body  thoroughly to remove the  shampoo.                           4.  Use CHG as you would any other liquid soap.  You can apply chg directly  to the skin and wash                       Gently with a scrungie or clean washcloth.  5.  Apply the CHG Soap to your body ONLY FROM THE NECK DOWN.   Do not use on face/ open                           Wound or open sores. Avoid contact with eyes, ears mouth and genitals (private parts).                       Wash face,  Genitals (private parts) with your normal soap.             6.  Wash thoroughly, paying special attention to the area where your surgery  will be performed.  7.  Thoroughly rinse your body with warm  water from the neck down.  8.  DO NOT shower/wash with your normal soap after using and rinsing off  the CHG Soap.                9.  Pat yourself dry with a clean towel.            10.  Wear clean pajamas.            11.  Place clean sheets on your bed the night of your first shower and do not  sleep with pets. Day of Surgery : Do not apply any lotions/deodorants the morning of surgery.  Please wear clean clothes to the hospital/surgery center.  FAILURE TO FOLLOW THESE INSTRUCTIONS MAY RESULT IN THE CANCELLATION OF YOUR SURGERY PATIENT SIGNATURE_________________________________  NURSE SIGNATURE__________________________________  ________________________________________________________________________

## 2017-06-18 ENCOUNTER — Encounter (HOSPITAL_COMMUNITY): Payer: Self-pay

## 2017-06-18 ENCOUNTER — Encounter (HOSPITAL_COMMUNITY)
Admission: RE | Admit: 2017-06-18 | Discharge: 2017-06-18 | Disposition: A | Discharge status: Home / Self Care | Payer: PRIVATE HEALTH INSURANCE | Source: Ambulatory Visit | Attending: Obstetrics and Gynecology | Admitting: Obstetrics and Gynecology

## 2017-06-18 DIAGNOSIS — N8501 Benign endometrial hyperplasia: Secondary | ICD-10-CM | POA: Diagnosis not present

## 2017-06-18 DIAGNOSIS — Z01818 Encounter for other preprocedural examination: Secondary | ICD-10-CM | POA: Insufficient documentation

## 2017-06-18 LAB — HCG, SERUM, QUALITATIVE: PREG SERUM: NEGATIVE

## 2017-06-18 LAB — ABO/RH: ABO/RH(D): O POS

## 2017-06-20 NOTE — Progress Notes (Signed)
Called Zoe Sherman and informed her to be here at 0530 to receive Lovenox. Verbalized that she would be here.

## 2017-06-22 NOTE — H&P (Signed)
Office Visit   06/02/2017 [facility]  [name], [name], MD  Obstetrics and Gynecology   Endometrial hyperplasia without atypia, complex +1 more  Dx   Office Visit ; Referred by Benita Stabile, MD  Reason for Visit   Additional Documentation   Vitals:   BP 118/70 (BP Location: Right Arm, Patient Position: Sitting, Cuff Size: Normal)   Pulse 76   Resp 16   Wt 135 lb (61.2 kg)   LMP 03/24/2015 (Within Weeks)   BMI 23.17 kg/m   BSA 1.66 m   Flowsheets:   Infectious Disease Screening,   Custom Formula Data,   MEWS Score,   Anthropometrics     Encounter Info:   Billing Info,   History,   Allergies,   Detailed Report     All Notes   Progress Notes by Patton Salles, MD at 06/02/2017 3:00 PM   Author: Patton Salles, MD Author Type: Physician Filed: 06/03/2017 7:30 PM  Note Status: Signed Cosign: Cosign Not Required Encounter Date: 06/02/2017  Editor: Patton Salles, MD (Physician)  Prior Versions: 1. Lum Babe CMA (Certified Medical Assistant) at 06/02/2017 3:12 PM - Sign at close encounter    GYNECOLOGY  VISIT   HPI: 56 y.o.   Married  Caucasian  female   G2P0002 with Patient's last menstrual period was 03/24/2015 (within weeks).   here for discussion of surgery.   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  Her office EMB did not detect the hyperplasia.   Her ovaries are normal on pelvic ultrasound done 03/20/17.  No change in her health since her office visit on 04/21/17.  Just had lab work done on 05/07/17  for her wellness program.   GYNECOLOGIC HISTORY: Patient's last menstrual period was 03/24/2015 (within weeks). Contraception:  Vasectomy/Postmenopausal Menopausal hormone therapy:  none Last mammogram:  01-22-17 Density B/Neg/BiRads1:TBC Last pap smear:   08-12-16 Neg:neg HR HPV;07-26-14 Neg:Neg HR HPV. Hx of LEEP 2006  CIN I.                OB History    Gravida Para Term Preterm AB Living   2 2 0 0 0 2   SAB TAB Ectopic Multiple Live Births   0 0 0 0 2         There are no active problems to display for this patient.       Past Medical History:  Diagnosis Date  . 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 Hyperlipidemia    tx with krill oil / diet  . Seasonal allergies          Past Surgical History:  Procedure Laterality Date  . CESAREAN SECTION  03/1992  . DILATATION & CURETTAGE/HYSTEROSCOPY WITH MYOSURE N/A 04/14/2017   Procedure: DILATATION & CURETTAGE/HYSTEROSCOPY WITH MYOSURE Polypectomy;  Surgeon: Patton Salles, MD;  Location: [facility];  Service: Gynecology;  Laterality: N/A;  possible endometrial polyp  . LEEP  2006   pathology shows CIN I with HPV effect, no squamous intraepitheal lesion  . TONSILLECTOMY    . VAGINAL DELIVERY  12/1989          Current Outpatient Prescriptions  Medication Sig Dispense Refill  . Multiple Vitamin (MULTIVITAMIN) tablet Take 1 tablet by mouth daily.    Marland Kitchen ibuprofen (ADVIL,MOTRIN) 800 MG tablet Take 1 tablet (800 mg total) by  mouth every 8 (eight) hours as needed. (Patient not taking: Reported on 06/02/2017) 30 tablet 0  . Krill Oil 500 MG CAPS Take 1 capsule by mouth daily.     No current facility-administered medications for this visit.      ALLERGIES: Tetracyclines & related       Family History  Problem Relation Age of Onset  . Hypertension Mother   . Hypertension Father   . Diabetes Father   . Hyperlipidemia Father   . Obesity Brother   . Obesity Sister   . Hypertension Sister   . Hypertension Sister   . Obesity Sister   . Heart failure Paternal Aunt   . Heart failure Paternal Grandfather     Social History        Social History  . Marital status: Married    Spouse name: N/A  . Number of children: 2  . Years of education: N/A      Occupational History  . Not on file.          Social History Main Topics  . Smoking status: Never Smoker  . Smokeless tobacco: Never Used  . Alcohol use No  . Drug use: No  . Sexual activity: Yes    Partners: Male    Birth control/ protection: Surgical, Post-menopausal     Comment: husband vasectomy       Other Topics Concern  . Not on file      Social History Narrative  . No narrative on file    ROS:  Pertinent items are noted in HPI.  PHYSICAL EXAMINATION:    BP 118/70 (BP Location: Right Arm, Patient Position: Sitting, Cuff Size: Normal)   Pulse 76   Resp 16   Wt 135 lb (61.2 kg)   LMP 03/24/2015 (Within Weeks)   BMI 23.17 kg/m     General appearance: alert, cooperative and appears stated age Head: Normocephalic, without obvious abnormality, atraumatic Neck: no adenopathy, supple, symmetrical, trachea midline and thyroid normal to inspection and palpation Lungs: clear to auscultation bilaterally Heart: regular rate and rhythm Abdomen: soft, non-tender, no masses,  no organomegaly Extremities: extremities normal, atraumatic, no cyanosis or edema Skin: Skin color, texture, turgor normal. No rashes or lesions Lymph nodes: Cervical, supraclavicular, and axillary nodes normal. No abnormal inguinal nodes palpated Neurologic: Grossly normal  Pelvic: External genitalia:  no lesions              Urethra:  normal appearing urethra with no masses, tenderness or lesions              Bartholins and Skenes: normal                 Vagina: normal appearing vagina with normal color and discharge, no lesions              Cervix: no lesions                Bimanual Exam:  Uterus:  normal size, contour, position, consistency, mobility, non-tender              Adnexa: no mass, fullness, tenderness              Chaperone was present for exam.  ASSESSMENT  Complex endometrial hyperplasia without atypia.  Hx Cesarean Section.  Remote hx LEEP for CIN I.  PLAN  Plan for total laparoscopic  hysterectomy with bilateral salpingectomy and cystoscopy.  Risks, benefits, and alternatives discussed with patient who wishes to proceed.  Discussed laparotomy  risk.  Surgical expectations and recovery discussed. No bowel prep needed.  She will receive Lovenox for DVT prophylaxis. CBC and CMP today.   An After Visit Summary was printed and given to the patient.

## 2017-06-23 ENCOUNTER — Ambulatory Visit (HOSPITAL_BASED_OUTPATIENT_CLINIC_OR_DEPARTMENT_OTHER): Payer: PRIVATE HEALTH INSURANCE | Admitting: Anesthesiology

## 2017-06-23 ENCOUNTER — Encounter (HOSPITAL_COMMUNITY): Payer: Self-pay | Admitting: *Deleted

## 2017-06-23 ENCOUNTER — Ambulatory Visit (HOSPITAL_BASED_OUTPATIENT_CLINIC_OR_DEPARTMENT_OTHER)
Admission: RE | Admit: 2017-06-23 | Discharge: 2017-06-23 | Disposition: A | Discharge status: Home / Self Care | Payer: PRIVATE HEALTH INSURANCE | Source: Ambulatory Visit | Attending: Obstetrics and Gynecology | Admitting: Obstetrics and Gynecology

## 2017-06-23 ENCOUNTER — Encounter (HOSPITAL_COMMUNITY)
Admission: RE | Disposition: A | Discharge status: Home / Self Care | Payer: Self-pay | Source: Ambulatory Visit | Attending: Obstetrics and Gynecology

## 2017-06-23 DIAGNOSIS — Z881 Allergy status to other antibiotic agents status: Secondary | ICD-10-CM | POA: Diagnosis not present

## 2017-06-23 DIAGNOSIS — Z833 Family history of diabetes mellitus: Secondary | ICD-10-CM | POA: Insufficient documentation

## 2017-06-23 DIAGNOSIS — N858 Other specified noninflammatory disorders of uterus: Secondary | ICD-10-CM | POA: Insufficient documentation

## 2017-06-23 DIAGNOSIS — K660 Peritoneal adhesions (postprocedural) (postinfection): Secondary | ICD-10-CM | POA: Diagnosis not present

## 2017-06-23 DIAGNOSIS — N736 Female pelvic peritoneal adhesions (postinfective): Secondary | ICD-10-CM | POA: Insufficient documentation

## 2017-06-23 DIAGNOSIS — Z79899 Other long term (current) drug therapy: Secondary | ICD-10-CM | POA: Diagnosis not present

## 2017-06-23 DIAGNOSIS — N950 Postmenopausal bleeding: Secondary | ICD-10-CM | POA: Diagnosis not present

## 2017-06-23 DIAGNOSIS — Z8249 Family history of ischemic heart disease and other diseases of the circulatory system: Secondary | ICD-10-CM | POA: Insufficient documentation

## 2017-06-23 DIAGNOSIS — E785 Hyperlipidemia, unspecified: Secondary | ICD-10-CM | POA: Diagnosis not present

## 2017-06-23 DIAGNOSIS — Z90710 Acquired absence of both cervix and uterus: Secondary | ICD-10-CM | POA: Diagnosis present

## 2017-06-23 DIAGNOSIS — N8501 Benign endometrial hyperplasia: Secondary | ICD-10-CM | POA: Diagnosis not present

## 2017-06-23 HISTORY — PX: CYSTOSCOPY: SHX5120

## 2017-06-23 HISTORY — PX: TOTAL LAPAROSCOPIC HYSTERECTOMY WITH SALPINGECTOMY: SHX6742

## 2017-06-23 LAB — CBC
HEMATOCRIT: 39.8 % (ref 36.0–46.0)
HEMOGLOBIN: 13.6 g/dL (ref 12.0–15.0)
MCH: 30.5 pg (ref 26.0–34.0)
MCHC: 34.2 g/dL (ref 30.0–36.0)
MCV: 89.2 fL (ref 78.0–100.0)
Platelets: 196 10*3/uL (ref 150–400)
RBC: 4.46 MIL/uL (ref 3.87–5.11)
RDW: 12.8 % (ref 11.5–15.5)
WBC: 13.4 10*3/uL — AB (ref 4.0–10.5)

## 2017-06-23 LAB — TYPE AND SCREEN
ABO/RH(D): O POS
Antibody Screen: NEGATIVE

## 2017-06-23 SURGERY — HYSTERECTOMY, TOTAL, LAPAROSCOPIC, WITH SALPINGECTOMY
Anesthesia: General

## 2017-06-23 MED ORDER — MORPHINE SULFATE (PF) 4 MG/ML IV SOLN: 2.0000 mg | INTRAVENOUS | Status: DC | PRN

## 2017-06-23 MED ORDER — ENOXAPARIN SODIUM 40 MG/0.4ML ~~LOC~~ SOLN
40.0000 mg | SUBCUTANEOUS | Status: AC
  Administered 2017-06-23: 40 mg via SUBCUTANEOUS

## 2017-06-23 MED ORDER — SUGAMMADEX SODIUM 200 MG/2ML IV SOLN
INTRAVENOUS | Status: DC | PRN
  Administered 2017-06-23: 120 mg via INTRAVENOUS

## 2017-06-23 MED ORDER — KETOROLAC TROMETHAMINE 30 MG/ML IJ SOLN
30.0000 mg | Freq: Four times a day (QID) | INTRAMUSCULAR | Status: DC
  Administered 2017-06-23: 18:00:00 30 mg via INTRAVENOUS
  Filled 2017-06-23: qty 1

## 2017-06-23 MED ORDER — LIDOCAINE 2% (20 MG/ML) 5 ML SYRINGE
INTRAMUSCULAR | Status: AC
  Filled 2017-06-23: qty 5

## 2017-06-23 MED ORDER — PROPOFOL 10 MG/ML IV BOLUS
INTRAVENOUS | Status: DC | PRN
  Administered 2017-06-23: 100 mg via INTRAVENOUS
  Administered 2017-06-23: 50 mg via INTRAVENOUS

## 2017-06-23 MED ORDER — OXYCODONE HCL 5 MG PO TABS
ORAL_TABLET | ORAL | Status: AC
  Filled 2017-06-23: qty 1

## 2017-06-23 MED ORDER — CEFOTETAN DISODIUM-DEXTROSE 2-2.08 GM-% IV SOLR
INTRAVENOUS | Status: AC
  Filled 2017-06-23: qty 50

## 2017-06-23 MED ORDER — IBUPROFEN 400 MG PO TABS
600.0000 mg | ORAL_TABLET | Freq: Four times a day (QID) | ORAL | Status: DC | PRN
  Administered 2017-06-23: 600 mg via ORAL
  Filled 2017-06-23: qty 1

## 2017-06-23 MED ORDER — DEXAMETHASONE SODIUM PHOSPHATE 4 MG/ML IJ SOLN
INTRAMUSCULAR | Status: DC | PRN
  Administered 2017-06-23: 10 mg via INTRAVENOUS

## 2017-06-23 MED ORDER — MENTHOL 3 MG MT LOZG: 1.0000 | LOZENGE | OROMUCOSAL | Status: DC | PRN

## 2017-06-23 MED ORDER — ACETAMINOPHEN 10 MG/ML IV SOLN
INTRAVENOUS | Status: DC | PRN
  Administered 2017-06-23: 1000 mg via INTRAVENOUS

## 2017-06-23 MED ORDER — MIDAZOLAM HCL 2 MG/2ML IJ SOLN
INTRAMUSCULAR | Status: AC
Start: 2017-06-23 — End: 2017-06-23
  Filled 2017-06-23: qty 2

## 2017-06-23 MED ORDER — ACETAMINOPHEN 10 MG/ML IV SOLN
INTRAVENOUS | Status: AC
  Filled 2017-06-23: qty 100

## 2017-06-23 MED ORDER — OXYCODONE-ACETAMINOPHEN 5-325 MG PO TABS: 1.0000 | ORAL_TABLET | ORAL | Status: DC | PRN

## 2017-06-23 MED ORDER — ROCURONIUM BROMIDE 10 MG/ML (PF) SYRINGE
PREFILLED_SYRINGE | INTRAVENOUS | Status: DC | PRN
  Administered 2017-06-23: 40 mg via INTRAVENOUS
  Administered 2017-06-23: 10 mg via INTRAVENOUS

## 2017-06-23 MED ORDER — PROPOFOL 500 MG/50ML IV EMUL
INTRAVENOUS | Status: AC
  Filled 2017-06-23: qty 50

## 2017-06-23 MED ORDER — DEXAMETHASONE SODIUM PHOSPHATE 10 MG/ML IJ SOLN
INTRAMUSCULAR | Status: AC
  Filled 2017-06-23: qty 1

## 2017-06-23 MED ORDER — CEFOTETAN DISODIUM-DEXTROSE 2-2.08 GM-% IV SOLR: 2.0000 g | INTRAVENOUS | Status: DC

## 2017-06-23 MED ORDER — LACTATED RINGERS IV SOLN
INTRAVENOUS | Status: DC
  Administered 2017-06-23 (×3): via INTRAVENOUS

## 2017-06-23 MED ORDER — IBUPROFEN 600 MG PO TABS: 600.0000 mg | ORAL_TABLET | Freq: Four times a day (QID) | ORAL | 0 refills | Status: DC | PRN

## 2017-06-23 MED ORDER — MIDAZOLAM HCL 5 MG/5ML IJ SOLN
INTRAMUSCULAR | Status: DC | PRN
  Administered 2017-06-23: 2 mg via INTRAVENOUS

## 2017-06-23 MED ORDER — SODIUM CHLORIDE 0.9 % IV SOLN
INTRAVENOUS | Status: DC | PRN
  Administered 2017-06-23: 60 mL

## 2017-06-23 MED ORDER — KETOROLAC TROMETHAMINE 30 MG/ML IJ SOLN
INTRAMUSCULAR | Status: DC | PRN
  Administered 2017-06-23: 30 mg via INTRAVENOUS

## 2017-06-23 MED ORDER — BUPIVACAINE HCL (PF) 0.25 % IJ SOLN
INTRAMUSCULAR | Status: DC | PRN
  Administered 2017-06-23: 10 mL

## 2017-06-23 MED ORDER — GLYCOPYRROLATE 0.2 MG/ML IJ SOLN
INTRAMUSCULAR | Status: DC | PRN
  Administered 2017-06-23: 0.2 mg via INTRAVENOUS

## 2017-06-23 MED ORDER — ROCURONIUM BROMIDE 50 MG/5ML IV SOSY
PREFILLED_SYRINGE | INTRAVENOUS | Status: AC
  Filled 2017-06-23: qty 5

## 2017-06-23 MED ORDER — LACTATED RINGERS IV SOLN: INTRAVENOUS | Status: DC

## 2017-06-23 MED ORDER — ONDANSETRON HCL 4 MG/2ML IJ SOLN: 4.0000 mg | Freq: Four times a day (QID) | INTRAMUSCULAR | Status: DC | PRN

## 2017-06-23 MED ORDER — OXYCODONE HCL 5 MG/5ML PO SOLN
5.0000 mg | Freq: Once | ORAL | Status: AC | PRN
Start: 2017-06-23 — End: 2017-06-23
  Filled 2017-06-23: qty 5

## 2017-06-23 MED ORDER — ARTIFICIAL TEARS OPHTHALMIC OINT
TOPICAL_OINTMENT | OPHTHALMIC | Status: AC
  Filled 2017-06-23: qty 3.5

## 2017-06-23 MED ORDER — HEMOSTATIC AGENTS (NO CHARGE) OPTIME
TOPICAL | Status: DC | PRN
  Administered 2017-06-23: 1 via TOPICAL

## 2017-06-23 MED ORDER — ONDANSETRON HCL 4 MG PO TABS: 4.0000 mg | ORAL_TABLET | Freq: Four times a day (QID) | ORAL | Status: DC | PRN

## 2017-06-23 MED ORDER — FENTANYL CITRATE (PF) 100 MCG/2ML IJ SOLN
INTRAMUSCULAR | Status: DC | PRN
  Administered 2017-06-23: 50 ug via INTRAVENOUS
  Administered 2017-06-23: 100 ug via INTRAVENOUS
  Administered 2017-06-23: 50 ug via INTRAVENOUS

## 2017-06-23 MED ORDER — ONDANSETRON HCL 4 MG/2ML IJ SOLN
INTRAMUSCULAR | Status: AC
  Filled 2017-06-23: qty 2

## 2017-06-23 MED ORDER — LIDOCAINE 2% (20 MG/ML) 5 ML SYRINGE
INTRAMUSCULAR | Status: DC | PRN
  Administered 2017-06-23: 40 mg via INTRAVENOUS

## 2017-06-23 MED ORDER — OXYCODONE HCL 5 MG PO TABS
5.0000 mg | ORAL_TABLET | Freq: Once | ORAL | Status: AC | PRN
  Administered 2017-06-23: 5 mg via ORAL

## 2017-06-23 MED ORDER — CEFOTETAN DISODIUM 2 G IJ SOLR
INTRAMUSCULAR | Status: DC | PRN
  Administered 2017-06-23: 2 g via INTRAVENOUS

## 2017-06-23 MED ORDER — OXYCODONE-ACETAMINOPHEN 5-325 MG PO TABS: 1.0000 | ORAL_TABLET | ORAL | 0 refills | Status: DC | PRN

## 2017-06-23 MED ORDER — FENTANYL CITRATE (PF) 100 MCG/2ML IJ SOLN
25.0000 ug | INTRAMUSCULAR | Status: DC | PRN
  Administered 2017-06-23: 50 ug via INTRAVENOUS

## 2017-06-23 MED ORDER — FENTANYL CITRATE (PF) 100 MCG/2ML IJ SOLN
INTRAMUSCULAR | Status: AC
  Filled 2017-06-23: qty 2

## 2017-06-23 MED ORDER — FENTANYL CITRATE (PF) 250 MCG/5ML IJ SOLN
INTRAMUSCULAR | Status: AC
  Filled 2017-06-23: qty 5

## 2017-06-23 MED ORDER — KETOROLAC TROMETHAMINE 30 MG/ML IJ SOLN
INTRAMUSCULAR | Status: AC
  Filled 2017-06-23: qty 1

## 2017-06-23 MED ORDER — ONDANSETRON HCL 4 MG/2ML IJ SOLN
INTRAMUSCULAR | Status: DC | PRN
  Administered 2017-06-23: 4 mg via INTRAVENOUS

## 2017-06-23 SURGICAL SUPPLY — 66 items
ADH SKN CLS APL DERMABOND .7 (GAUZE/BANDAGES/DRESSINGS) ×8
APL SRG 38 LTWT LNG FL B (MISCELLANEOUS) ×2
APPLICATOR ARISTA FLEXITIP XL (MISCELLANEOUS) ×4 IMPLANT
BARRIER ADHS 3X4 INTERCEED (GAUZE/BANDAGES/DRESSINGS) IMPLANT
BRR ADH 4X3 ABS CNTRL BYND (GAUZE/BANDAGES/DRESSINGS)
CABLE HIGH FREQUENCY MONO STRZ (ELECTRODE) ×4 IMPLANT
CANISTER SUCT 3000ML PPV (MISCELLANEOUS) ×4 IMPLANT
CATH FOLEY 3WAY  5CC 16FR (CATHETERS) ×2
CATH FOLEY 3WAY 5CC 16FR (CATHETERS) ×2 IMPLANT
CELL SAVER LIPIGURD (MISCELLANEOUS) IMPLANT
CLOTH BEACON ORANGE TIMEOUT ST (SAFETY) ×4 IMPLANT
COVER MAYO STAND STRL (DRAPES) ×4 IMPLANT
COVER TABLE BACK 60X90 (DRAPES) IMPLANT
DECANTER SPIKE VIAL GLASS SM (MISCELLANEOUS) IMPLANT
DERMABOND ADVANCED (GAUZE/BANDAGES/DRESSINGS) ×8
DERMABOND ADVANCED .7 DNX12 (GAUZE/BANDAGES/DRESSINGS) ×8 IMPLANT
DURAPREP 26ML APPLICATOR (WOUND CARE) ×4 IMPLANT
EXTRT SYSTEM ALEXIS 14CM (MISCELLANEOUS)
EXTRT SYSTEM ALEXIS 17CM (MISCELLANEOUS)
GLOVE BIO SURGEON STRL SZ 6.5 (GLOVE) ×6 IMPLANT
GLOVE BIO SURGEONS STRL SZ 6.5 (GLOVE) ×2
GLOVE BIOGEL PI IND STRL 7.0 (GLOVE) ×6 IMPLANT
GLOVE BIOGEL PI INDICATOR 7.0 (GLOVE) ×6
GOWN STRL REUS W/TWL LRG LVL3 (GOWN DISPOSABLE) ×16 IMPLANT
HEMOSTAT ARISTA ABSORB 3G PWDR (MISCELLANEOUS) ×4 IMPLANT
LIGASURE VESSEL 5MM BLUNT TIP (ELECTROSURGICAL) ×4 IMPLANT
NEEDLE INSUFFLATION 120MM (ENDOMECHANICALS) ×4 IMPLANT
OCCLUDER COLPOPNEUMO (BALLOONS) ×8 IMPLANT
PACK LAPAROSCOPY BASIN (CUSTOM PROCEDURE TRAY) ×4 IMPLANT
PACK TRENDGUARD 450 HYBRID PRO (MISCELLANEOUS) ×2 IMPLANT
PACK TRENDGUARD 600 HYBRD PROC (MISCELLANEOUS) IMPLANT
PLUG CATH AND CAP STER (CATHETERS) ×4 IMPLANT
POUCH LAPAROSCOPIC INSTRUMENT (MISCELLANEOUS) ×4 IMPLANT
PROTECTOR NERVE ULNAR (MISCELLANEOUS) ×8 IMPLANT
SCISSORS LAP 5X35 DISP (ENDOMECHANICALS) ×4 IMPLANT
SET CYSTO W/LG BORE CLAMP LF (SET/KITS/TRAYS/PACK) ×4 IMPLANT
SET IRRIG TUBING LAPAROSCOPIC (IRRIGATION / IRRIGATOR) ×4 IMPLANT
SET IRRIG Y TYPE TUR BLADDER L (SET/KITS/TRAYS/PACK) ×8 IMPLANT
SET TRI-LUMEN FLTR TB AIRSEAL (TUBING) ×4 IMPLANT
SLEEVE ADV FIXATION 5X100MM (TROCAR) ×4 IMPLANT
SLEEVE SURGEON STRL (DRAPES) ×4 IMPLANT
SUT VIC AB 0 CT1 27 (SUTURE) ×8
SUT VIC AB 0 CT1 27XBRD ANBCTR (SUTURE) ×4 IMPLANT
SUT VICRYL 0 UR6 27IN ABS (SUTURE) ×8 IMPLANT
SUT VICRYL 4-0 PS2 18IN ABS (SUTURE) ×4 IMPLANT
SUT VLOC 180 0 9IN  GS21 (SUTURE) ×2
SUT VLOC 180 0 9IN GS21 (SUTURE) ×2 IMPLANT
SYR 50ML LL SCALE MARK (SYRINGE) ×8 IMPLANT
SYRINGE 10CC LL (SYRINGE) ×4 IMPLANT
SYSTEM CARTER THOMASON II (TROCAR) ×4 IMPLANT
SYSTEM CONTND EXTRCTN KII BLLN (MISCELLANEOUS) IMPLANT
TIP RUMI ORANGE 6.7MMX12CM (TIP) IMPLANT
TIP UTERINE 5.1X6CM LAV DISP (MISCELLANEOUS) IMPLANT
TIP UTERINE 6.7X10CM GRN DISP (MISCELLANEOUS) IMPLANT
TIP UTERINE 6.7X6CM WHT DISP (MISCELLANEOUS) IMPLANT
TIP UTERINE 6.7X8CM BLUE DISP (MISCELLANEOUS) IMPLANT
TOWEL OR 17X24 6PK STRL BLUE (TOWEL DISPOSABLE) ×8 IMPLANT
TRAY FOLEY CATH SILVER 14FR (SET/KITS/TRAYS/PACK) ×4 IMPLANT
TRENDGUARD 450 HYBRID PRO PACK (MISCELLANEOUS) ×4
TRENDGUARD 600 HYBRID PROC PK (MISCELLANEOUS)
TROCAR ADV FIXATION 5X100MM (TROCAR) ×4 IMPLANT
TROCAR PORT AIRSEAL 8X120 (TROCAR) ×4 IMPLANT
TROCAR XCEL NON-BLD 5MMX100MML (ENDOMECHANICALS) ×4 IMPLANT
WARMER LAPAROSCOPE (MISCELLANEOUS) ×4 IMPLANT
WATER STERILE IRR 3000ML UROMA (IV SOLUTION) ×4 IMPLANT
WATER STERILE IRR 500ML POUR (IV SOLUTION) ×4 IMPLANT

## 2017-06-23 NOTE — Progress Notes (Signed)
Update to History and Physical  No marked changed in status since office visit.  Received Lovenox. Patient examined.  Ok to proceed with surgery.

## 2017-06-23 NOTE — Anesthesia Procedure Notes (Addendum)
Procedure Name: Intubation Date/Time: 06/23/2017 7:35 AM Performed by: Wanita Chamberlain Pre-anesthesia Checklist: Timeout performed, Patient identified, Emergency Drugs available, Suction available and Patient being monitored Patient Re-evaluated:Patient Re-evaluated prior to inductionOxygen Delivery Method: Circle system utilized Preoxygenation: Pre-oxygenation with 100% oxygen Intubation Type: IV induction and Cricoid Pressure applied Ventilation: Mask ventilation without difficulty Laryngoscope Size: Mac and 3 Grade View: Grade II Tube type: Oral Tube size: 7.0 mm Number of attempts: 1 Airway Equipment and Method: Bite block Placement Confirmation: ETT inserted through vocal cords under direct vision,  positive ETCO2 and breath sounds checked- equal and bilateral Secured at: 19 cm Tube secured with: Tape Dental Injury: Teeth and Oropharynx as per pre-operative assessment

## 2017-06-23 NOTE — Brief Op Note (Signed)
06/23/2017  10:54 AM  PATIENT:  Zoe Sherman  56 y.o. female  PRE-OPERATIVE DIAGNOSIS:  COMPLEX ENDOMETRIAL HYPERPLASIA WITHOUT ATYPIA  POST-OPERATIVE DIAGNOSIS:  COMPLEX ENDOMETRIAL HYPERPLASIA WITHOUT ATYPIA  PROCEDURE:  Procedure(s): HYSTERECTOMY TOTAL LAPAROSCOPIC WITH SALPINGECTOMY (Bilateral) CYSTOSCOPY (N/A)  Lysis of adhesions.  SURGEON:  Surgeon(s) and Role:    * Amundson Shirley Friar Silva, Brook E, MD - Primary    * Jerene BearsMiller, Mary S, MD - Assisting  PHYSICIAN ASSISTANT:   NA  ASSISTANTS: Annamaria BootsMary Suzanne Miller, MD   ANESTHESIA:   general and Intraperitoneal Ropivicaine, Local Marcaine, IV Tylenol  EBL:  Total I/O In: 1550 [I.V.:1550] Out: 235 [Urine:200; Blood:35]  BLOOD ADMINISTERED:none  DRAINS: none   LOCAL MEDICATIONS USED:  MARCAINE     SPECIMEN:  Source of Specimen:  uterus, cervix, bilateral tubes.  DISPOSITION OF SPECIMEN:  PATHOLOGY  COUNTS:  YES  TOURNIQUET:  * No tourniquets in log *  DICTATION: .Note written in EPIC  PLAN OF CARE: Outpatient with extended recovery.  PATIENT DISPOSITION:  PACU - hemodynamically stable.   Delay start of Pharmacological VTE agent (>24hrs) due to surgical blood loss or risk of bleeding: not applicable

## 2017-06-23 NOTE — Anesthesia Preprocedure Evaluation (Addendum)
Anesthesia Evaluation  Patient identified by MRN, date of birth, ID band Patient awake    Reviewed: Allergy & Precautions, NPO status , Patient's Chart, lab work & pertinent test results  History of Anesthesia Complications Negative for: history of anesthetic complications  Airway Mallampati: II  TM Distance: >3 FB Neck ROM: Full    Dental  (+) Teeth Intact, Dental Advisory Given   Pulmonary neg pulmonary ROS,    breath sounds clear to auscultation       Cardiovascular Exercise Tolerance: Good negative cardio ROS   Rhythm:Regular Rate:Normal     Neuro/Psych negative neurological ROS  negative psych ROS   GI/Hepatic negative GI ROS, Neg liver ROS,   Endo/Other  negative endocrine ROS  Renal/GU negative Renal ROS     Musculoskeletal negative musculoskeletal ROS (+)   Abdominal   Peds  Hematology negative hematology ROS (+)   Anesthesia Other Findings   Reproductive/Obstetrics                           Anesthesia Physical Anesthesia Plan  ASA: II  Anesthesia Plan: General   Post-op Pain Management:    Induction: Intravenous  PONV Risk Score and Plan: 3 and Ondansetron, Dexamethasone, Propofol and Midazolam  Airway Management Planned: Oral ETT  Additional Equipment: None  Intra-op Plan:   Post-operative Plan: Extubation in OR  Informed Consent: I have reviewed the patients History and Physical, chart, labs and discussed the procedure including the risks, benefits and alternatives for the proposed anesthesia with the patient or authorized representative who has indicated his/her understanding and acceptance.   Dental advisory given  Plan Discussed with: CRNA and Surgeon  Anesthesia Plan Comments:       Anesthesia Quick Evaluation

## 2017-06-23 NOTE — Transfer of Care (Signed)
Immediate Anesthesia Transfer of Care Note  Patient: Zoe Sherman  Procedure(s) Performed: Procedure(s): HYSTERECTOMY TOTAL LAPAROSCOPIC WITH SALPINGECTOMY (Bilateral) CYSTOSCOPY (N/A)  Patient Location: PACU  Anesthesia Type:General  Level of Consciousness: awake, alert , oriented and patient cooperative  Airway & Oxygen Therapy: Patient Spontanous Breathing and Patient connected to nasal cannula oxygen  Post-op Assessment: Report given to RN and Post -op Vital signs reviewed and stable  Post vital signs: Reviewed and stable  Last Vitals:  Vitals:   06/23/17 0613  BP: (!) 141/68  Pulse: 66  Resp: 16  Temp: 36.9 C    Last Pain:  Vitals:   06/23/17 0613  TempSrc: Oral      Patients Stated Pain Goal: 7 (06/23/17 [id number])  Complications: No apparent anesthesia complications

## 2017-06-23 NOTE — Progress Notes (Signed)
Day of Surgery Procedure(s) (LRB): HYSTERECTOMY TOTAL LAPAROSCOPIC WITH SALPINGECTOMY (Bilateral) CYSTOSCOPY (N/A)  Subjective: Patient reports tolerating PO and no problems voiding.   Good pain control.  Wants discharge.  Objective: I have reviewed patient's vital signs, intake and output and labs. Vitals:   06/23/17 1526 06/23/17 1645  BP: 135/68 123/63  Pulse: 60 73  Resp: 12 12  Temp: 98.6 F (37 C) 97.6 F (36.4 C)    I/O - 2717/685 cc.   CBC    Component Value Date/Time   WBC 13.4 (H) 06/23/2017 1530   RBC 4.46 06/23/2017 1530   HGB 13.6 06/23/2017 1530   HGB 13.3 06/02/2017 1603   HCT 39.8 06/23/2017 1530   HCT 39.4 06/02/2017 1603   PLT 196 06/23/2017 1530   PLT 226 06/02/2017 1603   MCV 89.2 06/23/2017 1530   MCV 88 06/02/2017 1603   MCH 30.5 06/23/2017 1530   MCHC 34.2 06/23/2017 1530   RDW 12.8 06/23/2017 1530   RDW 13.4 06/02/2017 1603     General: alert and cooperative Resp: clear to auscultation bilaterally Cardio: regular rate and rhythm, S1, S2 normal, no murmur, click, rub or gallop GI: soft, non-tender; bowel sounds normal; no masses,  no organomegaly Extremities: Ted hose on.  DPs 2+ bilaterally.  Vaginal Bleeding: minimal  Incisions:  Clean, dry, intact.   Assessment: s/p Procedure(s): HYSTERECTOMY TOTAL LAPAROSCOPIC WITH SALPINGECTOMY (Bilateral) CYSTOSCOPY (N/A): progressing well and ready for discharge.   Plan: Discharge home Surgical findings and procedure reviewed with patient.   Rx for Percocet and Motrin.  Instructions and precautions reviewed with patient.  Follow up in 7 days.    LOS: 0 days    Melony OverlyBrook A Silva 06/23/2017, 5:56 PM

## 2017-06-23 NOTE — Discharge Instructions (Signed)

## 2017-06-23 NOTE — Progress Notes (Signed)
Pt admitted around 1530. Discharged at 1815. Patient voiding without difficulty. Pain well-controlled. No nausea after clear liquid diet. AVS reviewed with patient. Patient understands follow-up appointments and d/c instructions.

## 2017-06-23 NOTE — Op Note (Addendum)
OPERATIVE REPORT   PREOPERATIVE DIAGNOSIS: Complex endometrial hyperplasia without atypia.  POSTOPERATIVE DIAGNOSIS: Complex endometrial hyperplasia without atypia, omental adhesions.  PROCEDURES: Total laparoscopic hysterectomy with bilateral salpingectomy, lysis of adhesions, cystoscopy.  SURGEON: Randye Lobo, M.D.  ASSISTANT: Annamaria Boots, M.D.  ANESTHESIA: General endotracheal, intraperitoneal ropivicaine 30 mL diluted in 30 mL of normal saline, local with 0.25% Marcaine, IV Tylenol.  IVF:  1550 cc.   ESTIMATED BLOOD LOSS:   35 cc.  URINE OUTPUT: 235 cc.   COMPLICATIONS: None.  INDICATIONS FOR THE PROCEDURE:    The patient is a 56 year old P28 Caucasian female who presents with postmenopausal bleeding.  She is status post hysteroscopic resection of an endometrial polyp which on final pathology showed endometrial hyperplasia without atypia.  Patient wishes for hysterectomy and declines progesterone therapy.  A plan is now made to proceed with a total laparoscopic hysterectomy with bilateral salpingectomy and cystoscopy after risks, benefits, and alternatives were reviewed.   FINDINGS:    Laparoscopy revealed a normal uterus, bilateral tubes and ovaries.  The upper abdomen was normal and demonstrated a normal liver.  There were adhesions of the omentum to the abdominal wall just lateral to the umbilicus.  All adhesions were lysed 100%.  There were usual adhesions of the bladder to the lower uterine segment from the patient's prior Cesarean Section.  Cystoscopy at the termination of the procedure showed the bladder to be normal throughout 360 degrees including the bladder dome and trigone. There was no evidence of any foreign body or lesions n the bladder or the urethra.  Both of the ureters were noted to be patent bilaterally.  SPECIMENS:    The uterus, cervix, and bilateral tubes were went to pathology.   DESCRIPTION OF PROCEDURE:   The patient was  reidentified in the preoperative hold area.  She did receive Cefotetan IV for antibiotic prophylaxis. She received Lovenox, TED hose and PAS stockings for DVT prophylaxis.  In the operating room, the patient was placed in the dorsal lithotomy position on the operating room table. The Trendguard was used for stabilization.  Her legs were placed in the [place] stirrups and her arms were both tucked at her sides. The patient received general endotracheal anesthesia. The abdomen and vagina were then sterilely prepped and she was sterilely draped.  A speculum was placed in the vagina and a single-tooth tenaculum was placed on the anterior cervical lip. A figure-of-eight suture of 0 Vicryl was placed on each the anterior and the posterior cervical lips. The uterus was sounded to __almost 7 ___ cm. The cervix was then dilated with [facility] dilators. A #  __6____ RUMI tip with a KOH ring was then placed through the cervix and into the uterine cavity without difficulty. The remaining vaginal instruments were then removed. A Foley catheter was placed inside the bladder.  Attention was turned to the abdomen where the umbilical region was injected with 0.25% Marcaine and a small incision created.  A Veress needle was then used to insufflate the abdomen with CO2 gas after a saline drop test was performed and the fluid flowed freely.  A 5 mm umbilical incision was created with a scalpel after the skin. A 5 mm camera port was then placed using the Optiview.   At this time, the patient was placed in Trendelenburg position. An inspection of the abdomen and pelvis was performed. The findings are as noted above.   A 8 mm Air Seal trocar was placed in the right  lower abdomen under visualization of the laparoscope after the skin was injected with 0.25% Marcaine.  Ropivicine 30 cc diluted in 30 cc normal saline was placed inside the peritoneal cavity.  Lysis of omental adhesions was then performed with a laparoscopic  monopolar scissors. This allowed for placement of two 5 mm trocars in the left mid abdomen and the left lower abdomen after the skin was injected locally with 0.25% Marcaine.  The 5 mm trocars were then placed under visualization of the laparoscope. Remaining lysis of adhesions close to the umbilical trocar was performed with the monopolar scissors.    The left fallopian tube was grasped and the Ligasure was used to cauterize and cut through the mesosalpinx and then the proximal tube.  The specimen was sent to pathology.  The left utero-ovarian ligament was similarly cauterized and cut with the Ligasure. The left round ligament was then cauterized and divided with same instrument. Dissection was performed to the anterior and posterior leaves of the broad ligaments using the monopolar laparoscopic scissors. The incision was carried across the anterior cul- de-sac along the vesicouterine fold and the bladder was dissected away from the cervix using monopolar cautery scissors. This was performed in stages as the adhesions from the patient's prior Cesarean Section was thick in the lower uterine segment.  The peritoneum was taken down posteriorly. The left uterine artery was skeletonized at this time using sharp dissection and monopolar cautery.   It was then cauterized and later cut with the Ligasure instrument.  Attention was turned to the patient's right-hand side at this time. The same procedure that was performed on the left side was repeated on the right side with respect to the isolation, cautery, and transection of the vessels and the bladder flap dissection.   The right fallopian tube was removed from the peritoneal cavity as well.  The bladder was retrograde filled with normal saline using a 3 way foley catheter.  The bladder was noted to be intact.  The saline was drained from the bladder.  The bladder was then dissected away from the cervix further.  The KOH ring was nicely visible. The  colpotomy incision was performed with the laparoscopic monopolar scissors in a circumferential fashion. The specimen was then removed from the peritoneal cavity and was sent to Pathology. The vaginal balloon occluder was inadvertently placed in the rectum, and it was deflated and removed.  A new sterile balloon occluder was then placed in the vagina. The vaginal cuff was sutured at this time using a running suture of 0 V-Loc. The vagina was closed from the patient's right hand side to the left hand side and then back 2 sutures towards the midline. This provided good full-thickness closure of the vaginal cuff.  The laparoscopic needle for suturing was removed from the peritoneal cavity.  The pelvis was irrigated and suctioned.  The pneumoperitoneal was let down.  There was good hemostasis of the operative sites and pedicles. Arista was placed over the surgical bed.   The pneumoperitoneum was released and an 8 mm trocar was removed and the fascia was closed with a figure of eight 0/0 vicryl suture.   The remaining left sided laparoscopic trocars were removed.     The patient's 3-way Foley catheter was removed at this time and cystoscopy was performed and the findings are as noted above. The Foley catheter was left out.   Final inspection of the vagina demonstrated good hemostasis of the vaginal cuff.  A rectal examination documented a  normal rectum.    All skin incisions were closed with subcuticular sutures of 4-0 Vicryl. Dermabond was placed over the incisions.  This concluded the patient's procedure. She was extubated and escorted to the recovery room in stable and awake condition. There were no complications to the procedure. All needle, instrument, and sponge counts were correct.

## 2017-06-24 ENCOUNTER — Encounter (HOSPITAL_BASED_OUTPATIENT_CLINIC_OR_DEPARTMENT_OTHER): Payer: Self-pay | Admitting: Obstetrics and Gynecology

## 2017-06-24 NOTE — Anesthesia Postprocedure Evaluation (Signed)
Anesthesia Post Note  Patient: Zoe Sherman  Procedure(s) Performed: Procedure(s) (LRB): HYSTERECTOMY TOTAL LAPAROSCOPIC WITH SALPINGECTOMY (Bilateral) CYSTOSCOPY (N/A)     Patient location during evaluation: PACU Anesthesia Type: General Level of consciousness: awake and alert Pain management: pain level controlled Vital Signs Assessment: post-procedure vital signs reviewed and stable Respiratory status: spontaneous breathing, nonlabored ventilation, respiratory function stable and patient connected to nasal cannula oxygen Cardiovascular status: blood pressure returned to baseline and stable Postop Assessment: no signs of nausea or vomiting Anesthetic complications: no    Last Vitals:  Vitals:   06/23/17 1526 06/23/17 1645  BP: 135/68 123/63  Pulse: 60 73  Resp: 12 12  Temp: 37 C 36.4 C    Last Pain:  Vitals:   06/23/17 1817  TempSrc:   PainSc: 0-No pain                 Breccan Galant

## 2017-06-30 ENCOUNTER — Encounter: Payer: Self-pay | Admitting: Obstetrics and Gynecology

## 2017-06-30 ENCOUNTER — Ambulatory Visit (INDEPENDENT_AMBULATORY_CARE_PROVIDER_SITE_OTHER): Payer: PRIVATE HEALTH INSURANCE | Admitting: Obstetrics and Gynecology

## 2017-06-30 VITALS — BP 116/60 | HR 76 | Resp 16 | Wt 131.0 lb

## 2017-06-30 DIAGNOSIS — Z9889 Other specified postprocedural states: Secondary | ICD-10-CM

## 2017-06-30 DIAGNOSIS — D72829 Elevated white blood cell count, unspecified: Secondary | ICD-10-CM

## 2017-06-30 NOTE — Progress Notes (Signed)
GYNECOLOGY  VISIT   HPI: 56 y.o.   Married  Caucasian  female   G2P0002 with Patient's last menstrual period was 03/24/2015 (within weeks).   here for 1 week post op    HYSTERECTOMY TOTAL LAPAROSCOPIC WITH SALPINGECTOMY (Bilateral )  CYSTOSCOPY (N/A )  Final pathology showed adenomyosis and atrophic endometrium.   Cervix with atrophy.  CBC the evening of surgery showed WBC 13.4 and Hgb 13.6  Very pleased with surgery.  No bleeding.  Normal bladder and bowel function. Just a little tired.   GYNECOLOGIC HISTORY: Patient's last menstrual period was 03/24/2015 (within weeks). Contraception:  Hysterectomy Menopausal hormone therapy:  none Last mammogram: 01/22/17 BIRADS 1 negative/density b Last pap smear:   08-12-16 Neg:neg HR HPV;07-26-14 Neg:Neg HR HPV. 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        OB History    Gravida Para Term Preterm AB Living   2 2 0 0 0 2   SAB TAB Ectopic Multiple Live Births   0 0 0 0 2         Patient Active Problem List   Diagnosis Date Noted  . Status post laparoscopic hysterectomy 06/23/2017    Past Medical History:  Diagnosis Date  . 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 Hyperlipidemia    tx with krill oil / diet  . Seasonal allergies     Past Surgical History:  Procedure Laterality Date  . CESAREAN SECTION  03/1992  . CYSTOSCOPY N/A 06/23/2017   Procedure: CYSTOSCOPY;  Surgeon: Patton SallesAmundson C Silva, Brook E, MD;  [facility];  Service: Gynecology;  Laterality: N/A;  . DILATATION & CURETTAGE/HYSTEROSCOPY WITH MYOSURE N/A 04/14/2017   Procedure: DILATATION & CURETTAGE/HYSTEROSCOPY WITH MYOSURE Polypectomy;  Surgeon: Patton SallesBrook E Amundson C Silva, MD;  Location: [facility];  Service: Gynecology;  Laterality: N/A;  possible endometrial polyp  . LEEP  2006   pathology shows CIN I with HPV effect, no squamous intraepitheal lesion  . TONSILLECTOMY    . TOTAL LAPAROSCOPIC HYSTERECTOMY WITH SALPINGECTOMY Bilateral 06/23/2017   Procedure: HYSTERECTOMY  TOTAL LAPAROSCOPIC WITH SALPINGECTOMY;  Surgeon: Patton SallesAmundson C Silva, Brook E, MD;  [facility] [place];  Service: Gynecology;  Laterality: Bilateral;  . VAGINAL DELIVERY  12/1989    Current Outpatient Prescriptions  Medication Sig Dispense Refill  . atorvastatin (LIPITOR) 10 MG tablet Take 10 mg by mouth daily.    Boris Lown. Krill Oil 500 MG CAPS Take 1 capsule by mouth daily.    . Multiple Vitamin (MULTIVITAMIN) tablet Take 1 tablet by mouth daily.    Marland Kitchen. ibuprofen (ADVIL,MOTRIN) 600 MG tablet Take 1 tablet (600 mg total) by mouth every 6 (six) hours as needed (mild pain). (Patient not taking: Reported on 06/30/2017) 30 tablet 0  . oxyCODONE-acetaminophen (PERCOCET/ROXICET) 5-325 MG tablet Take 1-2 tablets by mouth every 4 (four) hours as needed for severe pain (moderate to severe pain (when tolerating fluids)). (Patient not taking: Reported on 06/30/2017) 30 tablet 0   No current facility-administered medications for this visit.      ALLERGIES: Tetracyclines & related  Family History  Problem Relation Age of Onset  . Hypertension Mother   . Hypertension Father   . Diabetes Father   . Hyperlipidemia Father   . Obesity Brother   . Obesity Sister   . Hypertension Sister   . Hypertension Sister   . Obesity Sister   . Heart failure Paternal Aunt   . Heart failure Paternal Grandfather  Social History   Social History  . Marital status: Married    Spouse name: N/A  . Number of children: 2  . Years of education: N/A   Occupational History  . Not on file.   Social History Main Topics  . Smoking status: Never Smoker  . Smokeless tobacco: Never Used  . Alcohol use No  . Drug use: No  . Sexual activity: Yes    Partners: Male    Birth control/ protection: Surgical, Post-menopausal     Comment: husband vasectomy   Other Topics Concern  . Not on file   Social History Narrative  . No narrative on file    ROS:  Pertinent items are noted in HPI.  PHYSICAL EXAMINATION:     BP 116/60 (BP Location: Right Arm, Patient Position: Sitting, Cuff Size: Normal)   Pulse 76   Resp 16   Wt 131 lb (59.4 kg)   LMP 03/24/2015 (Within Weeks)   BMI 21.80 kg/m     General appearance: alert, cooperative and appears stated age   Abdomen: incisions with Dermabond on them. Abdomen is  soft, non-tender, no masses,  no organomegaly.  Chaperone was present for exam.  ASSESSMENT  Status post laparoscopic hysterectomy with bilateral salpingectomy and cystoscopy.   Elevated WBC immediately post op, attributed to demargination.  PLAN  Surgical pathology reviewed with patient.  Check CBC with diff now.  No driving for one more week.  Follow up for 6 week post op visit.  Annual exam at the end of September or beginning of October.    An After Visit Summary was printed and given to the patient.

## 2017-07-01 LAB — CBC WITH DIFFERENTIAL/PLATELET
BASOS ABS: 0 10*3/uL (ref 0.0–0.2)
Basos: 0 %
EOS (ABSOLUTE): 0.1 10*3/uL (ref 0.0–0.4)
Eos: 2 %
HEMOGLOBIN: 13 g/dL (ref 11.1–15.9)
Hematocrit: 38.6 % (ref 34.0–46.6)
IMMATURE GRANS (ABS): 0 10*3/uL (ref 0.0–0.1)
IMMATURE GRANULOCYTES: 0 %
LYMPHS: 22 %
Lymphocytes Absolute: 1.4 10*3/uL (ref 0.7–3.1)
MCH: 30.4 pg (ref 26.6–33.0)
MCHC: 33.7 g/dL (ref 31.5–35.7)
MCV: 90 fL (ref 79–97)
MONOCYTES: 7 %
Monocytes Absolute: 0.5 10*3/uL (ref 0.1–0.9)
NEUTROS ABS: 4.3 10*3/uL (ref 1.4–7.0)
NEUTROS PCT: 69 %
PLATELETS: 258 10*3/uL (ref 150–379)
RBC: 4.27 x10E6/uL (ref 3.77–5.28)
RDW: 14 % (ref 12.3–15.4)
WBC: 6.3 10*3/uL (ref 3.4–10.8)

## 2017-08-01 ENCOUNTER — Ambulatory Visit (INDEPENDENT_AMBULATORY_CARE_PROVIDER_SITE_OTHER): Payer: PRIVATE HEALTH INSURANCE | Admitting: Obstetrics and Gynecology

## 2017-08-01 ENCOUNTER — Encounter: Payer: Self-pay | Admitting: Obstetrics and Gynecology

## 2017-08-01 VITALS — BP 126/80 | HR 70 | Ht 64.25 in | Wt 136.8 lb

## 2017-08-01 DIAGNOSIS — Z90710 Acquired absence of both cervix and uterus: Secondary | ICD-10-CM

## 2017-08-01 NOTE — Progress Notes (Signed)
GYNECOLOGY  VISIT   HPI: 56 y.o.   Married  Caucasian  female   G2P0002 with Patient's last menstrual period was 03/24/2015 (within weeks).   here for 6 week post op HYSTERECTOMY TOTAL LAPAROSCOPIC WITH SALPINGECTOMY (Bilateral )  CYSTOSCOPY (N/A )  No bladder or bowel problems.  No vaginal bleeding.   Started walking.   GYNECOLOGIC HISTORY: Patient's last menstrual period was 03/24/2015 (within weeks). Contraception:  Hysterectomy Menopausal hormone therapy:  none Last mammogram:  01/22/17 BIRADS 1 negative/density b Last pap smear:   08-12-16 Neg:neg HR HPV;07-26-14 Neg:Neg HR HPV. 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        OB History    Gravida Para Term Preterm AB Living   2 2 0 0 0 2   SAB TAB Ectopic Multiple Live Births   0 0 0 0 2         Patient Active Problem List   Diagnosis Date Noted  . Status post laparoscopic hysterectomy 06/23/2017    Past Medical History:  Diagnosis Date  . 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 Hyperlipidemia    tx with krill oil / diet  . Seasonal allergies     Past Surgical History:  Procedure Laterality Date  . CESAREAN SECTION  03/1992  . CYSTOSCOPY N/A 06/23/2017   Procedure: CYSTOSCOPY;  Surgeon: Patton SallesAmundson C Silva, Kadir Azucena E, MD;  [facility] [place];  Service: Gynecology;  Laterality: N/A;  . DILATATION & CURETTAGE/HYSTEROSCOPY WITH MYOSURE N/A 04/14/2017   Procedure: DILATATION & CURETTAGE/HYSTEROSCOPY WITH MYOSURE Polypectomy;  Surgeon: Patton SallesBrook E Amundson C Silva, MD;  Location: [facility];  Service: Gynecology;  Laterality: N/A;  possible endometrial polyp  . LEEP  2006   pathology shows CIN I with HPV effect, no squamous intraepitheal lesion  . TONSILLECTOMY    . TOTAL LAPAROSCOPIC HYSTERECTOMY WITH SALPINGECTOMY Bilateral 06/23/2017   Procedure: HYSTERECTOMY TOTAL LAPAROSCOPIC WITH SALPINGECTOMY;  Surgeon: Patton SallesAmundson C Silva, Marcos Peloso E, MD;  [facility] [place];  Service: Gynecology;  Laterality: Bilateral;  . VAGINAL  DELIVERY  12/1989    Current Outpatient Prescriptions  Medication Sig Dispense Refill  . atorvastatin (LIPITOR) 10 MG tablet Take 10 mg by mouth daily.    Boris Lown. Krill Oil 500 MG CAPS Take 1 capsule by mouth daily.    . Multiple Vitamin (MULTIVITAMIN) tablet Take 1 tablet by mouth daily.     No current facility-administered medications for this visit.      ALLERGIES: Tetracyclines & related  Family History  Problem Relation Age of Onset  . Hypertension Mother   . Hypertension Father   . Diabetes Father   . Hyperlipidemia Father   . Obesity Brother   . Obesity Sister   . Hypertension Sister   . Hypertension Sister   . Obesity Sister   . Heart failure Paternal Aunt   . Heart failure Paternal Grandfather     Social History   Social History  . Marital status: Married    Spouse name: N/A  . Number of children: 2  . Years of education: N/A   Occupational History  . Not on file.   Social History Main Topics  . Smoking status: Never Smoker  . Smokeless tobacco: Never Used  . Alcohol use No  . Drug use: No  . Sexual activity: Yes    Partners: Male    Birth control/ protection: Surgical, Post-menopausal     Comment: husband vasectomy/Hyst   Other Topics Concern  .  Not on file   Social History Narrative  . No narrative on file    ROS:  Pertinent items are noted in HPI.  PHYSICAL EXAMINATION:    BP 126/80 (BP Location: Right Arm, Patient Position: Sitting, Cuff Size: Small)   Pulse 70   Ht 5' 4.25" (1.632 m)   Wt 136 lb 12.8 oz (62.1 kg)   LMP 03/24/2015 (Within Weeks)   BMI 23.30 kg/m     General appearance: alert, cooperative and appears stated age   Abdomen: incisions intact, soft, non-tender, no masses,  no organomegaly  Pelvic: External genitalia:  no lesions              Urethra:  normal appearing urethra with no masses, tenderness or lesions              Bartholins and Skenes: normal                 Vagina: normal appearing vagina with normal color and  discharge, no lesions              Cervix: absent.  Suture line intact.  Suture not visible but is palpable under mucosa. Bimanual Exam:  Uterus:  Absent.               Adnexa: no mass, fullness, tenderness               Chaperone was present for exam.  ASSESSMENT  Status post laparoscopic hysterectomy with bilateral salpingectomy and cystoscopy.  Doing well.   PLAN  Resume all normal activities in 2 weeks.  Return for annual in September for annual exam.    An After Visit Summary was printed and given to the patient.

## 2017-08-13 ENCOUNTER — Ambulatory Visit: Payer: PRIVATE HEALTH INSURANCE | Admitting: Nurse Practitioner

## 2017-09-12 NOTE — Progress Notes (Signed)
56 y.o. G59P0002 Married Caucasian female here for annual exam.    States vaginal dryness with intercourse.   Does labs through the city of [place].   PCP:   Lottie Mussel, MD  Patient's last menstrual period was 03/24/2015 (within weeks).           Sexually active: Yes.   female The current method of family planning is post menopausal status/Hysterectomy.    Exercising: Yes.    Walks 5x/week Smoker:  no  Health Maintenance: Pap: 08-12-16 Neg:Neg HR HPV, 07-26-13 Neg:Neg HR HPV.  Final pathology from hysterectomy showed benign cervix.  History of abnormal Pap:  Yes, Hx ?LEEP 2006 with Dr.Fore - LGSIL with positive endocervical and exocervical margins.  MMG: 01-22-17 Density B/Neg/BiRads1:TBC Colonoscopy: NEVER. BMD: 2003  Result  ? [facility] TDaP:  05/2013 Gardasil:   no HIV: No Hep C: No Screening Labs:  Hb today: PCP, Urine today: not done   reports that she has never smoked. She has never used smokeless tobacco. She reports that she does not drink alcohol or use drugs.  Past Medical History:  Diagnosis Date  . 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 Hyperlipidemia    tx with krill oil / diet  . Seasonal allergies     Past Surgical History:  Procedure Laterality Date  . CESAREAN SECTION  03/1992  . CYSTOSCOPY N/A 06/23/2017   Procedure: CYSTOSCOPY;  Surgeon: Patton Salles, MD;  Location: [facility];  Service: Gynecology;  Laterality: N/A;  . DILATATION & CURETTAGE/HYSTEROSCOPY WITH MYOSURE N/A 04/14/2017   Procedure: DILATATION & CURETTAGE/HYSTEROSCOPY WITH MYOSURE Polypectomy;  Surgeon: Patton Salles, MD;  Location: [facility];  Service: Gynecology;  Laterality: N/A;  possible endometrial polyp  . LEEP  2006   pathology shows CIN I with HPV effect, no squamous intraepitheal lesion  . TONSILLECTOMY    . TOTAL LAPAROSCOPIC HYSTERECTOMY WITH SALPINGECTOMY Bilateral 06/23/2017   Procedure: HYSTERECTOMY TOTAL LAPAROSCOPIC WITH  SALPINGECTOMY;  Surgeon: Patton Salles, MD;  Location[facility];  Service: Gynecology;  Laterality: Bilateral;  . VAGINAL DELIVERY  12/1989    Current Outpatient Prescriptions  Medication Sig Dispense Refill  . atorvastatin (LIPITOR) 10 MG tablet Take 10 mg by mouth daily.    Boris Lown Oil 500 MG CAPS Take 1 capsule by mouth daily.    . Multiple Vitamin (MULTIVITAMIN) tablet Take 1 tablet by mouth daily.     No current facility-administered medications for this visit.     Family History  Problem Relation Age of Onset  . Hypertension Mother   . Hypertension Father   . Diabetes Father   . Hyperlipidemia Father   . Obesity Brother   . Obesity Sister   . Hypertension Sister   . Hypertension Sister   . Obesity Sister   . Heart failure Paternal Aunt   . Heart failure Paternal Grandfather     ROS:  Pertinent items are noted in HPI.  Otherwise, a comprehensive ROS was negative.  Exam:   BP 122/80 (BP Location: Right Arm, Patient Position: Sitting, Cuff Size: Normal)   Pulse 70   Resp 14   Ht 5' 4.5" (1.638 m)   Wt 135 lb 9.6 oz (61.5 kg)   LMP 03/24/2015 (Within Weeks)   BMI 22.92 kg/m     General appearance: alert, cooperative and appears stated age Head: Normocephalic, without obvious abnormality, atraumatic Neck: no  adenopathy, supple, symmetrical, trachea midline and thyroid normal to inspection and palpation Lungs: clear to auscultation bilaterally Breasts: normal appearance, no masses or tenderness, No nipple retraction or dimpling, No nipple discharge or bleeding, No axillary or supraclavicular adenopathy Heart: regular rate and rhythm Abdomen: soft, non-tender; no masses, no organomegaly Extremities: extremities normal, atraumatic, no cyanosis or edema Skin: Skin color, texture, turgor normal. No rashes or lesions Lymph nodes: Cervical, supraclavicular, and axillary nodes normal. No abnormal inguinal nodes palpated Neurologic: Grossly  normal  Pelvic: External genitalia:  no lesions              Urethra:  normal appearing urethra with no masses, tenderness or lesions              Bartholins and Skenes: normal                 Vagina: normal appearing vagina with normal color and discharge, no lesions.  Vaginal cuff suture from hysterectomy removed with dressing forceps.  I feels a slight pocket at the right vaginal apex.              Cervix:  Absent.               Pap taken: No. Bimanual Exam:  Uterus:  Absent.               Adnexa: no mass, fullness, tenderness              Rectal exam: Yes.  .  Confirms.              Anus:  normal sphincter tone, no lesions  Chaperone was present for exam.  Assessment:   Well woman visit with normal exam. Status post laparoscopic hysterectomy and bilateral salpingectomy.  Vaginal atrophy.   Plan: Mammogram screening discussed. Recommended self breast awareness. Pap and HR HPV as above. Guidelines for Calcium, Vitamin D, regular exercise program including cardiovascular and weight bearing exercise. Referral for colonoscopy.  Rx for Vagifem.  Instructed in use.  Discussed potential increased risk of breast cancer. We discussed water based and oil based lubricants. Follow up annually and prn.   After visit summary provided.

## 2017-09-15 ENCOUNTER — Encounter: Payer: Self-pay | Admitting: Obstetrics and Gynecology

## 2017-09-15 ENCOUNTER — Ambulatory Visit (INDEPENDENT_AMBULATORY_CARE_PROVIDER_SITE_OTHER): Payer: PRIVATE HEALTH INSURANCE | Admitting: Obstetrics and Gynecology

## 2017-09-15 VITALS — BP 122/80 | HR 70 | Resp 14 | Ht 64.5 in | Wt 135.6 lb

## 2017-09-15 DIAGNOSIS — Z01419 Encounter for gynecological examination (general) (routine) without abnormal findings: Secondary | ICD-10-CM | POA: Diagnosis not present

## 2017-09-15 DIAGNOSIS — Z1211 Encounter for screening for malignant neoplasm of colon: Secondary | ICD-10-CM | POA: Diagnosis not present

## 2017-09-15 MED ORDER — ESTRADIOL 10 MCG VA TABS: ORAL_TABLET | VAGINAL | 11 refills | Status: DC

## 2017-09-15 NOTE — Patient Instructions (Signed)

## 2018-01-14 ENCOUNTER — Other Ambulatory Visit: Payer: Self-pay | Admitting: Obstetrics and Gynecology

## 2018-01-14 DIAGNOSIS — Z1231 Encounter for screening mammogram for malignant neoplasm of breast: Secondary | ICD-10-CM

## 2018-02-02 ENCOUNTER — Ambulatory Visit
Admission: RE | Admit: 2018-02-02 | Discharge: 2018-02-02 | Disposition: A | Discharge status: Home / Self Care | Payer: PRIVATE HEALTH INSURANCE | Source: Ambulatory Visit | Attending: Obstetrics and Gynecology | Admitting: Obstetrics and Gynecology

## 2018-02-02 DIAGNOSIS — Z1231 Encounter for screening mammogram for malignant neoplasm of breast: Secondary | ICD-10-CM

## 2018-09-16 ENCOUNTER — Encounter: Payer: Self-pay | Admitting: Obstetrics and Gynecology

## 2018-09-16 ENCOUNTER — Ambulatory Visit: Payer: PRIVATE HEALTH INSURANCE | Admitting: Obstetrics and Gynecology

## 2018-09-16 VITALS — BP 122/72 | HR 66 | Resp 14 | Ht 64.0 in | Wt 136.0 lb

## 2018-09-16 DIAGNOSIS — Z01419 Encounter for gynecological examination (general) (routine) without abnormal findings: Secondary | ICD-10-CM | POA: Diagnosis not present

## 2018-09-16 DIAGNOSIS — Z1211 Encounter for screening for malignant neoplasm of colon: Secondary | ICD-10-CM

## 2018-09-16 MED ORDER — ESTRADIOL 2 MG VA RING: 2.0000 mg | VAGINAL_RING | VAGINAL | 3 refills | Status: DC

## 2018-09-16 NOTE — Progress Notes (Signed)
57 y.o. G62P0002 Married Caucasian female here for annual exam.    Started Vagifem this summer but did not keep up with it. Does have painful intercourse.  Doing labs with PCP.  Due in December.   PCP:  Lottie Mussel, MD   Patient's last menstrual period was 03/24/2015 (within weeks).           Sexually active: Yes.    The current method of family planning is status post hysterectomy and post menopausal status.    Exercising: Yes.    Walking 2 miles 5 days a week.  Smoker:  no  Health Maintenance: Pap:  08/12/16  History of abnormal Pap:  Yes Hx ?LEEP 2006 with Dr.Fore - LGSIL with positive endocervical and exocervical margins.   Hysterectomy final pathology specimen of cervix - atrophy.  MMG:  02/02/18  Category 1 neg/ density A  Colonoscopy:  NA.    BMD:   2003 Result  [facility]? TDaP:  05/2013 Gardasil:   no HIV: in pregnancy.  Hep C: --- Screening Labs: ---   reports that she has never smoked. She has never used smokeless tobacco. She reports that she does not drink alcohol or use drugs.  Past Medical History:  Diagnosis Date  . 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 Hyperlipidemia    tx with krill oil / diet  . Seasonal allergies     Past Surgical History:  Procedure Laterality Date  . CESAREAN SECTION  03/1992  . CYSTOSCOPY N/A 06/23/2017   Procedure: CYSTOSCOPY;  Surgeon: Patton Salles, MD;  Location: [facility];  Service: Gynecology;  Laterality: N/A;  . DILATATION & CURETTAGE/HYSTEROSCOPY WITH MYOSURE N/A 04/14/2017   Procedure: DILATATION & CURETTAGE/HYSTEROSCOPY WITH MYOSURE Polypectomy;  Surgeon: Patton Salles, MD;  Location: [facility];  Service: Gynecology;  Laterality: N/A;  possible endometrial polyp  . LEEP  2006   pathology shows CIN I with HPV effect, no squamous intraepitheal lesion  . TONSILLECTOMY    . TOTAL LAPAROSCOPIC HYSTERECTOMY WITH SALPINGECTOMY Bilateral 06/23/2017   Procedure: HYSTERECTOMY TOTAL  LAPAROSCOPIC WITH SALPINGECTOMY;  Surgeon: Patton Salles, MD;  Location: [facility];  Service: Gynecology;  Laterality: Bilateral;  . VAGINAL DELIVERY  12/1989    Current Outpatient Medications  Medication Sig Dispense Refill  . atorvastatin (LIPITOR) 10 MG tablet Take 10 mg by mouth daily.    Boris Lown Oil 500 MG CAPS Take 1 capsule by mouth daily.    . Multiple Vitamin (MULTIVITAMIN) tablet Take 1 tablet by mouth daily.    Marland Kitchen estradiol (ESTRING) 2 MG vaginal ring Place 2 mg vaginally every 3 (three) months. Insert a new ring into vagina every 3 months 1 each 3   No current facility-administered medications for this visit.     Family History  Problem Relation Age of Onset  . Hypertension Mother   . Hypertension Father   . Diabetes Father   . Hyperlipidemia Father   . Obesity Brother   . Obesity Sister   . Hypertension Sister   . Hypertension Sister   . Obesity Sister   . Heart failure Paternal Aunt   . Heart failure Paternal Grandfather     Review of Systems  All other systems reviewed and are negative.   Exam:   BP 122/72   Pulse 66   Resp 14   Ht 5\' 4"  (1.626 m)   Wt 136 lb (  61.7 kg)   LMP 03/24/2015 (Within Weeks)   BMI 23.34 kg/m     General appearance: alert, cooperative and appears stated age Head: Normocephalic, without obvious abnormality, atraumatic Neck: no adenopathy, supple, symmetrical, trachea midline and thyroid normal to inspection and palpation Lungs: clear to auscultation bilaterally Breasts: normal appearance, no masses or tenderness, No nipple retraction or dimpling, No nipple discharge or bleeding, No axillary or supraclavicular adenopathy Heart: regular rate and rhythm Abdomen: soft, non-tender; no masses, no organomegaly Extremities: extremities normal, atraumatic, no cyanosis or edema Skin: Skin color, texture, turgor normal. No rashes or lesions Lymph nodes: Cervical, supraclavicular, and axillary nodes normal. No  abnormal inguinal nodes palpated Neurologic: Grossly normal  Pelvic: External genitalia:  no lesions              Urethra:  normal appearing urethra with no masses, tenderness or lesions              Bartholins and Skenes: normal                 Vagina: normal appearing vagina with normal color and discharge, no lesions              Cervix:  absent              Pap taken: No. Bimanual Exam:  Uterus: absent              Adnexa: no mass, fullness, tenderness              Rectal exam: Yes.  .  Confirms.              Anus:  normal sphincter tone, no lesions  Chaperone was present for exam.  Assessment:   Well woman visit with normal exam. Status post laparoscopic hysterectomy and bilateral salpingectomy.  Vaginal atrophy.  Hx LEEP - LGSIL.  Plan: Mammogram screening. Recommended self breast awareness. Pap and HR HPV as above. Guidelines for Calcium, Vitamin D, regular exercise program including cardiovascular and weight bearing exercise. Rx for Estring.  Instructed in use.  I discussed potential effect on breast cancer.  Labs through PCP.  Referral for colonoscopy.  Follow up annually and prn.      After visit summary provided.

## 2018-09-16 NOTE — Patient Instructions (Signed)

## 2018-09-17 ENCOUNTER — Ambulatory Visit: Payer: PRIVATE HEALTH INSURANCE | Admitting: Obstetrics and Gynecology

## 2018-09-28 ENCOUNTER — Ambulatory Visit: Payer: PRIVATE HEALTH INSURANCE | Admitting: Obstetrics and Gynecology

## 2018-10-21 ENCOUNTER — Encounter: Payer: Self-pay | Admitting: Psychiatry

## 2018-12-29 ENCOUNTER — Other Ambulatory Visit: Payer: Self-pay | Admitting: Obstetrics and Gynecology

## 2018-12-29 DIAGNOSIS — Z1231 Encounter for screening mammogram for malignant neoplasm of breast: Secondary | ICD-10-CM

## 2019-02-04 ENCOUNTER — Ambulatory Visit: Payer: PRIVATE HEALTH INSURANCE

## 2019-02-08 ENCOUNTER — Ambulatory Visit
Admission: RE | Admit: 2019-02-08 | Discharge: 2019-02-08 | Disposition: A | Discharge status: Home / Self Care | Payer: PRIVATE HEALTH INSURANCE | Source: Ambulatory Visit | Attending: Obstetrics and Gynecology | Admitting: Obstetrics and Gynecology

## 2019-02-08 DIAGNOSIS — Z1231 Encounter for screening mammogram for malignant neoplasm of breast: Secondary | ICD-10-CM

## 2019-09-09 NOTE — Progress Notes (Signed)
58 y.o. 342P0002 Married Caucasian female here for annual exam.    Did not fill Estring due to cost.  She has tried Replens and coconut oil.  Got her flu vaccine already.  Has a new grandson.  Expecting a new grand daughter.   PCP:  Nita SellsJohn Hall, MD   Patient's last menstrual period was 03/24/2015 (within weeks).           Sexually active: Yes.    The current method of family planning is post menopausal status.    Exercising: Yes.    walking Smoker:  no  Health Maintenance: Pap:  08-12-16 Neg:Neg HR HPV, 07-26-13 Neg:Neg HR HPV.  Final pathology from hysterectomy showed benign cervix History of abnormal Pap:  Yes, Hx ?LEEP 2006 with Dr.Fore - LGSIL with positive endocervical and exocervical margins. MMG: 02-08-19 3D/Neg/density B/BiRads1  Colonoscopy:  NEVER.  To do cologuard with PCP BMD:   n/a  Result  n/a TDaP:  05/2013 Gardasil:   n/a HIV:no Hep C:no Screening Labs:  ----   reports that she has never smoked. She has never used smokeless tobacco. She reports that she does not drink alcohol or use drugs.  Past Medical History:  Diagnosis Date  . 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 Hyperlipidemia    tx with krill oil / diet  . Seasonal allergies     Past Surgical History:  Procedure Laterality Date  . CESAREAN SECTION  03/1992  . CYSTOSCOPY N/A 06/23/2017   Procedure: CYSTOSCOPY;  Surgeon: Patton SallesAmundson C Silva, Mikail Goostree E, MD;  [facility];  Service: Gynecology;  Laterality: N/A;  . DILATATION & CURETTAGE/HYSTEROSCOPY WITH MYOSURE N/A 04/14/2017   Procedure: DILATATION & CURETTAGE/HYSTEROSCOPY WITH MYOSURE Polypectomy;  Surgeon: Patton SallesBrook E Amundson C Silva, MD;  Location: [facility];  Service: Gynecology;  Laterality: N/A;  possible endometrial polyp  . LEEP  2006   pathology shows CIN I with HPV effect, no squamous intraepitheal lesion  . TONSILLECTOMY    . TOTAL LAPAROSCOPIC HYSTERECTOMY WITH SALPINGECTOMY Bilateral 06/23/2017   Procedure: HYSTERECTOMY TOTAL  LAPAROSCOPIC WITH SALPINGECTOMY;  Surgeon: Patton SallesAmundson C Silva, Elizabethann Lackey E, MD;  [facility] [place];  Service: Gynecology;  Laterality: Bilateral;  . VAGINAL DELIVERY  12/1989    Current Outpatient Medications  Medication Sig Dispense Refill  . atorvastatin (LIPITOR) 10 MG tablet Take 10 mg by mouth daily.    . Multiple Vitamin (MULTIVITAMIN) tablet Take 1 tablet by mouth daily.     No current facility-administered medications for this visit.     Family History  Problem Relation Age of Onset  . Hypertension Mother   . Hypertension Father   . Diabetes Father   . Hyperlipidemia Father   . Obesity Brother   . Obesity Sister   . Hypertension Sister   . Hypertension Sister   . Obesity Sister   . Heart failure Paternal Aunt   . Heart failure Paternal Grandfather   . Breast cancer Neg Hx     Review of Systems  All other systems reviewed and are negative.   Exam:   BP (!) 144/84   Pulse 66   Temp 98.1 F (36.7 C) (Temporal)   Resp 14   Ht 5' 4.5" (1.638 m)   Wt 144 lb 6.4 oz (65.5 kg)   LMP 03/24/2015 (Within Weeks)   BMI 24.40 kg/m     General appearance: alert, cooperative and appears stated age Head: normocephalic, without obvious abnormality, atraumatic Neck: no adenopathy,  supple, symmetrical, trachea midline and thyroid normal to inspection and palpation Lungs: clear to auscultation bilaterally Breasts: normal appearance, no masses or tenderness, No nipple retraction or dimpling, No nipple discharge or bleeding, No axillary adenopathy Heart: regular rate and rhythm Abdomen: soft, non-tender; no masses, no organomegaly Extremities: extremities normal, atraumatic, no cyanosis or edema Skin: skin color, texture, turgor normal. No rashes or lesions Lymph nodes: cervical, supraclavicular, and axillary nodes normal. Neurologic: grossly normal  Pelvic: External genitalia:  no lesions              No abnormal inguinal nodes palpated.              Urethra:   normal appearing urethra with no masses, tenderness or lesions              Bartholins and Skenes: normal                 Vagina: normal appearing vagina with normal color and discharge, no lesions              Cervix: absent.  Scar tissue at vaginal apex. Feels like a pocket to the right and a alight tightness in the midline.  No band noted.               Pap taken: No. Bimanual Exam:  Uterus:   Absent.               Adnexa: no mass, fullness, tenderness              Rectal exam: Yes.  .  Confirms.              Anus:  normal sphincter tone, no lesions  Chaperone was present for exam.  Assessment:   Well woman visit with normal exam. Status post laparoscopic hysterectomy and bilateral salpingectomy.  Vaginal atrophy. Scar tissue at vaginal apex.  Pulling of vaginal mucosa. Hx LEEP - LGSIL.  Plan: Mammogram screening discussed. Self breast awareness reviewed. Pap and HR HPV as above. Guidelines for Calcium, Vitamin D, regular exercise program including cardiovascular and weight bearing exercise. We discussed options for tx atrophy - Osphena, Intrarosa, vaginal estrogen tablets and creams, and vit E suppositories.  She will try the Vit E vaginal suppositories.  We did discuss potential effect of breast cancer and use of vaginal estrogen. She will let me know if the pain with intercourse does not improve.  We discussed the possibility of surgical vaginal cuff exploration and revision.  Labs with PCP.  Follow up annually and prn.   After visit summary provided.

## 2019-09-16 ENCOUNTER — Other Ambulatory Visit: Payer: Self-pay

## 2019-09-20 ENCOUNTER — Encounter: Payer: Self-pay | Admitting: Obstetrics and Gynecology

## 2019-09-20 ENCOUNTER — Ambulatory Visit (INDEPENDENT_AMBULATORY_CARE_PROVIDER_SITE_OTHER): Payer: PRIVATE HEALTH INSURANCE | Admitting: Obstetrics and Gynecology

## 2019-09-20 ENCOUNTER — Other Ambulatory Visit: Payer: Self-pay

## 2019-09-20 VITALS — BP 144/84 | HR 66 | Temp 98.1°F | Resp 14 | Ht 64.5 in | Wt 144.4 lb

## 2019-09-20 DIAGNOSIS — Z01419 Encounter for gynecological examination (general) (routine) without abnormal findings: Secondary | ICD-10-CM | POA: Diagnosis not present

## 2019-09-20 NOTE — Patient Instructions (Signed)

## 2020-02-25 ENCOUNTER — Other Ambulatory Visit: Payer: Self-pay | Admitting: Obstetrics and Gynecology

## 2020-02-25 DIAGNOSIS — Z1231 Encounter for screening mammogram for malignant neoplasm of breast: Secondary | ICD-10-CM

## 2020-03-23 ENCOUNTER — Ambulatory Visit
Admission: RE | Admit: 2020-03-23 | Discharge: 2020-03-23 | Disposition: A | Discharge status: Home / Self Care | Payer: PRIVATE HEALTH INSURANCE | Source: Ambulatory Visit | Attending: Obstetrics and Gynecology | Admitting: Obstetrics and Gynecology

## 2020-03-23 ENCOUNTER — Other Ambulatory Visit: Payer: Self-pay

## 2020-03-23 DIAGNOSIS — Z1231 Encounter for screening mammogram for malignant neoplasm of breast: Secondary | ICD-10-CM

## 2020-09-12 LAB — COLOGUARD: COLOGUARD: NEGATIVE

## 2020-09-20 ENCOUNTER — Ambulatory Visit: Payer: PRIVATE HEALTH INSURANCE | Admitting: Obstetrics and Gynecology

## 2020-09-25 NOTE — Progress Notes (Signed)
59 y.o. G89P0002 Married Caucasian female here for annual exam.   Using vaginal moisturizers.   Caring for 2 new grandchildren.   Not vaccinated against Covid.  She is thinking about it.   PCP:  Nita Sells, MD   Patient's last menstrual period was 03/24/2015 (within weeks).           Sexually active: Yes.    The current method of family planning is post menopausal status.    Exercising: Yes.    walks daily Smoker:  no  Health Maintenance: Pap: 08-12-16 Neg:Neg HR HPV, 07-26-13 Neg:Neg HR HPV. Final pathology from hysterectomy showed benign cervix History of abnormal Pap:  Yes, Hx ?LEEP 2006 with Dr.Fore- LGSIL with positive endocervical and exocervical margins. MMG: 03-23-20  3D/Neg/density B/BiRads1 Colonoscopy: Cologuard pending.   BMD:   n/a  Result  N/a. TDaP:  05/2013 Gardasil:   no HIV: no Hep C: no Screening Labs:  PCP.    reports that she has never smoked. She has never used smokeless tobacco. She reports that she does not drink alcohol and does not use drugs.  Past Medical History:  Diagnosis Date  . 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 Hyperlipidemia    tx with krill oil / diet  . Seasonal allergies     Past Surgical History:  Procedure Laterality Date  . CESAREAN SECTION  03/1992  . CYSTOSCOPY N/A 06/23/2017   Procedure: CYSTOSCOPY;  Surgeon: Patton Salles, MD;  Location: [facility];  Service: Gynecology;  Laterality: N/A;  . DILATATION & CURETTAGE/HYSTEROSCOPY WITH MYOSURE N/A 04/14/2017   Procedure: DILATATION & CURETTAGE/HYSTEROSCOPY WITH MYOSURE Polypectomy;  Surgeon: Patton Salles, MD;  Location: [facility];  Service: Gynecology;  Laterality: N/A;  possible endometrial polyp  . LEEP  2006   pathology shows CIN I with HPV effect, no squamous intraepitheal lesion  . TONSILLECTOMY    . TOTAL LAPAROSCOPIC HYSTERECTOMY WITH SALPINGECTOMY Bilateral 06/23/2017   Procedure: HYSTERECTOMY TOTAL LAPAROSCOPIC WITH SALPINGECTOMY;  Surgeon:  Patton Salles, MD;  Location: [facility];  Service: Gynecology;  Laterality: Bilateral;  . VAGINAL DELIVERY  12/1989    Current Outpatient Medications  Medication Sig Dispense Refill  . atorvastatin (LIPITOR) 10 MG tablet Take 10 mg by mouth daily.    Marland Kitchen ELDERBERRY PO Take 1 tablet by mouth daily.    . Multiple Vitamin (MULTIVITAMIN) tablet Take 1 tablet by mouth daily.    . Omega-3 Fatty Acids (FISH OIL) 1000 MG CAPS Take 1 capsule by mouth daily.     No current facility-administered medications for this visit.    Family History  Problem Relation Age of Onset  . Hypertension Mother   . Hypertension Father   . Diabetes Father   . Hyperlipidemia Father   . Obesity Brother   . Obesity Sister   . Hypertension Sister   . Hypertension Sister   . Obesity Sister   . Heart failure Paternal Aunt   . Heart failure Paternal Grandfather   . Breast cancer Neg Hx     Review of Systems  All other systems reviewed and are negative.   Exam:   BP (!) 158/80   Pulse 66   Resp 12   Ht 5' 4.25" (1.632 m)   Wt 138 lb (62.6 kg)   LMP 03/24/2015 (Within Weeks)   BMI 23.50 kg/m     General appearance: alert, cooperative and appears stated age Head: normocephalic,  without obvious abnormality, atraumatic Neck: no adenopathy, supple, symmetrical, trachea midline and thyroid normal to inspection and palpation Lungs: clear to auscultation bilaterally Breasts: normal appearance, no masses or tenderness, No nipple retraction or dimpling, No nipple discharge or bleeding, No axillary adenopathy Heart: regular rate and rhythm Abdomen: soft, non-tender; no masses, no organomegaly Extremities: extremities normal, atraumatic, no cyanosis or edema Skin: skin color, texture, turgor normal. No rashes or lesions Lymph nodes: cervical, supraclavicular, and axillary nodes normal. Neurologic: grossly normal  Pelvic: External genitalia:  no lesions              No abnormal  inguinal nodes palpated.              Urethra:  normal appearing urethra with no masses, tenderness or lesions              Bartholins and Skenes: normal                 Vagina: normal appearing vagina with normal color and discharge, no lesions              Cervix: absent. Some scar tissue at apex.              Pap taken: No. Bimanual Exam:  Uterus:  absent              Adnexa: no mass, fullness, tenderness              Rectal exam: Yes.  .  Confirms.              Anus:  normal sphincter tone, no lesions  Chaperone was present for exam.  Assessment:   Well woman visit with normal exam. Status post laparoscopic hysterectomy and bilateral salpingectomy.  Vaginal atrophy. Scar tissue at vaginal apex.  Pulling of vaginal mucosa. Hx LEEP - LGSIL.  Plan: Mammogram screening discussed. Self breast awareness reviewed. Pap and HR HPV as above. Guidelines for Calcium, Vitamin D, regular exercise program including cardiovascular and weight bearing exercise. We reviewed vit E vaginal suppositories.  We will let me know if she would like vaginal estrogen. We discussed Covid vaccine and flu vaccine.  I referred her to the [facility] web site for questions. Follow up annually and prn.   After visit summary provided.

## 2020-09-26 ENCOUNTER — Ambulatory Visit (INDEPENDENT_AMBULATORY_CARE_PROVIDER_SITE_OTHER): Payer: PRIVATE HEALTH INSURANCE | Admitting: Obstetrics and Gynecology

## 2020-09-26 ENCOUNTER — Encounter: Payer: Self-pay | Admitting: Obstetrics and Gynecology

## 2020-09-26 ENCOUNTER — Other Ambulatory Visit: Payer: Self-pay

## 2020-09-26 VITALS — BP 158/80 | HR 66 | Resp 12 | Ht 64.25 in | Wt 138.0 lb

## 2020-09-26 DIAGNOSIS — Z01419 Encounter for gynecological examination (general) (routine) without abnormal findings: Secondary | ICD-10-CM | POA: Diagnosis not present

## 2020-09-26 NOTE — Patient Instructions (Signed)

## 2021-01-06 ENCOUNTER — Ambulatory Visit
Admission: EM | Admit: 2021-01-06 | Discharge: 2021-01-06 | Disposition: A | Discharge status: Home / Self Care | Payer: PRIVATE HEALTH INSURANCE | Attending: Emergency Medicine | Admitting: Emergency Medicine

## 2021-01-06 ENCOUNTER — Encounter: Payer: Self-pay | Admitting: Emergency Medicine

## 2021-01-06 DIAGNOSIS — I10 Essential (primary) hypertension: Secondary | ICD-10-CM

## 2021-01-06 DIAGNOSIS — H6593 Unspecified nonsuppurative otitis media, bilateral: Secondary | ICD-10-CM | POA: Diagnosis not present

## 2021-01-06 MED ORDER — PREDNISONE 10 MG PO TABS: 10.0000 mg | ORAL_TABLET | Freq: Every day | ORAL | 0 refills | Status: AC

## 2021-01-06 MED ORDER — FLUTICASONE PROPIONATE 50 MCG/ACT NA SUSP: 1.0000 | Freq: Every day | NASAL | 0 refills | Status: DC

## 2021-01-06 MED ORDER — CLONIDINE HCL 0.1 MG PO TABS
0.1000 mg | ORAL_TABLET | Freq: Once | ORAL | Status: AC
  Administered 2021-01-06: 0.1 mg via ORAL

## 2021-01-06 MED ORDER — AMLODIPINE BESYLATE 5 MG PO TABS: 5.0000 mg | ORAL_TABLET | Freq: Every day | ORAL | 0 refills | Status: AC

## 2021-01-06 NOTE — ED Provider Notes (Signed)
[facility]   [id number] 01/06/21 Arrival Time: 1412   Chief Complaint  Patient presents with  . Hypertension     SUBJECTIVE: History from: patient.  Zoe Sherman is a 60 y.o. female who presented to the urgent care for complaint of feeling fuzzy since yesterday.  Took her blood pressure at home was over 200 systolic.  Blood pressure in office today is 215/79.  No history of hypertension.  States average blood pressure is running normal.   Does  have a PCP.  Denies HA, vision changes, dizziness, lightheadedness, chest pain, shortness of breath, numbness or tingling in extremities, abdominal pain, changes in bowel or bladder habits.    ROS: As per HPI.  All other pertinent ROS negative.     Past Medical History:  Diagnosis Date  . 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 Hyperlipidemia    tx with krill oil / diet  . Seasonal allergies    Past Surgical History:  Procedure Laterality Date  . CESAREAN SECTION  03/1992  . CYSTOSCOPY N/A 06/23/2017   Procedure: CYSTOSCOPY;  Surgeon: Patton Salles, MD;  Location: [facility];  Service: Gynecology;  Laterality: N/A;  . DILATATION & CURETTAGE/HYSTEROSCOPY WITH MYOSURE N/A 04/14/2017   Procedure: DILATATION & CURETTAGE/HYSTEROSCOPY WITH MYOSURE Polypectomy;  Surgeon: Patton Salles, MD;  Location: [facility];  Service: Gynecology;  Laterality: N/A;  possible endometrial polyp  . LEEP  2006   pathology shows CIN I with HPV effect, no squamous intraepitheal lesion  . TONSILLECTOMY    . TOTAL LAPAROSCOPIC HYSTERECTOMY WITH SALPINGECTOMY Bilateral 06/23/2017   Procedure: HYSTERECTOMY TOTAL LAPAROSCOPIC WITH SALPINGECTOMY;  Surgeon: Patton Salles, MD;  Location: [facility];  Service: Gynecology;  Laterality: Bilateral;  . VAGINAL DELIVERY  12/1989   Allergies  Allergen Reactions  . Tetracyclines & Related Other (See Comments)    unknown   No current facility-administered  medications on file prior to encounter.   Current Outpatient Medications on File Prior to Encounter  Medication Sig Dispense Refill  . atorvastatin (LIPITOR) 10 MG tablet Take 10 mg by mouth daily.    Marland Kitchen ELDERBERRY PO Take 1 tablet by mouth daily.    . Multiple Vitamin (MULTIVITAMIN) tablet Take 1 tablet by mouth daily.    . Omega-3 Fatty Acids (FISH OIL) 1000 MG CAPS Take 1 capsule by mouth daily.     Social History   Socioeconomic History  . Marital status: Married    Spouse name: Not on file  . Number of children: 2  . Years of education: Not on file  . Highest education level: Not on file  Occupational History  . Not on file  Tobacco Use  . Smoking status: Never Smoker  . Smokeless tobacco: Never Used  Vaping Use  . Vaping Use: Never used  Substance and Sexual Activity  . Alcohol use: No  . Drug use: No  . Sexual activity: Yes    Partners: Male    Birth control/protection: Surgical, Post-menopausal    Comment: husband vasectomy/Hyst  Other Topics Concern  . Not on file  Social History Narrative  . Not on file   Social Determinants of Health   Financial Resource Strain: Not on file  Food Insecurity: Not on file  Transportation Needs: Not on file  Physical Activity: Not on file  Stress: Not on file  Social Connections: Not on file  Intimate Partner Violence: Not on file  Family History  Problem Relation Age of Onset  . Hypertension Mother   . Hypertension Father   . Diabetes Father   . Hyperlipidemia Father   . Obesity Brother   . Obesity Sister   . Hypertension Sister   . Hypertension Sister   . Obesity Sister   . Heart failure Paternal Aunt   . Heart failure Paternal Grandfather   . Breast cancer Neg Hx     OBJECTIVE:  Vitals:   01/06/21 1426 01/06/21 1427 01/06/21 1522  BP:  (!) 215/79 (!) 189/79  Pulse:  (!) 113   Resp:  18   Temp:  98.2 F (36.8 C)   TempSrc:  Oral   SpO2:  98% 98%  Weight: 130 lb (59 kg)    Height: 5\' 5"  (1.651 m)        Physical Exam Vitals and nursing note reviewed.  Constitutional:      General: She is not in acute distress.    Appearance: Normal appearance. She is normal weight. She is not ill-appearing, toxic-appearing or diaphoretic.  HENT:     Head: Normocephalic.     Right Ear: Ear canal and external ear normal. A middle ear effusion is present. There is no impacted cerumen.     Left Ear: Ear canal and external ear normal. A middle ear effusion is present. There is no impacted cerumen.  Neck:     Vascular: No carotid bruit.  Cardiovascular:     Rate and Rhythm: Normal rate and regular rhythm.     Pulses: Normal pulses.     Heart sounds: Normal heart sounds. No murmur heard. No friction rub. No gallop.   Pulmonary:     Effort: Pulmonary effort is normal. No respiratory distress.     Breath sounds: Normal breath sounds. No stridor. No wheezing, rhonchi or rales.  Chest:     Chest wall: No tenderness.  Neurological:     Mental Status: She is alert and oriented to person, place, and time.     LABS:  No results found for this or any previous visit (from the past 24 hour(s)).   ASSESSMENT & PLAN:  1. Essential hypertension     Meds ordered this encounter  Medications  . cloNIDine (CATAPRES) tablet 0.1 mg  . amLODipine (NORVASC) 5 MG tablet    Sig: Take 1 tablet (5 mg total) by mouth daily.    Dispense:  30 tablet    Refill:  0  . predniSONE (DELTASONE) 10 MG tablet    Sig: Take 1 tablet (10 mg total) by mouth daily for 5 days.    Dispense:  5 tablet    Refill:  0  . fluticasone (FLONASE) 50 MCG/ACT nasal spray    Sig: Place 1 spray into both nostrils daily for 14 days.    Dispense:  16 g    Refill:  0   Patient is stable at discharge.  History reviewed her blood pressure review and blood pressure above 140/80.  Will prescribe Norvasc 5 mg daily.  She was advised to follow-up with PCP for further evaluation and medication management  Discharge Instructions Flonase and  prednisone prescribed for middle ear effusion Norvasc was prescribed/take as directed.  Please follow-up with PCP within 2  weeks for blood pressure check and medication management Please continue to monitor blood pressure at home and keep a log Eat a well balanced diet of fruits, vegetables and lean meats.  Avoid foods high in fat and salt Drink water.  At least half your body weight in ounces Exercise for at least 30 minutes daily Return or go to the ED if you have any new or worsening symptoms such as vision changes, fatigue, dizziness, chest pain, shortness of breath, nausea, swelling in your hands or feet, urinary symptoms, etc...  Reviewed expectations re: course of current medical issues. Questions answered. Outlined signs and symptoms indicating need for more acute intervention. Patient verbalized understanding. After Visit Summary given.         Durward Parcel, FNP 01/06/21 1534

## 2021-01-06 NOTE — ED Triage Notes (Signed)
Pt reports she has been feeling "fuzzy feeling"  And face feels flushed on and off since yesterday.  Took bp today at home and it was in the 200's sys.  Pt a&o x 4, ambulatory to triage.

## 2021-01-06 NOTE — Discharge Instructions (Addendum)
Flonase and prednisone were prescribed for middle ear effusion Norvasc was prescribed/take as directed.  Please follow-up with PCP within 2  weeks for blood pressure check and medication management Please continue to monitor blood pressure at home and keep a log Eat a well balanced diet of fruits, vegetables and lean meats.  Avoid foods high in fat and salt Drink water.  At least half your body weight in ounces Exercise for at least 30 minutes daily Return or go to the ED if you have any new or worsening symptoms such as vision changes, fatigue, dizziness, chest pain, shortness of breath, nausea, swelling in your hands or feet, urinary symptoms, etc..Marland Kitchen

## 2021-04-17 ENCOUNTER — Other Ambulatory Visit: Payer: Self-pay | Admitting: Obstetrics and Gynecology

## 2021-04-17 DIAGNOSIS — Z1231 Encounter for screening mammogram for malignant neoplasm of breast: Secondary | ICD-10-CM

## 2021-06-07 ENCOUNTER — Ambulatory Visit
Admission: RE | Admit: 2021-06-07 | Discharge: 2021-06-07 | Disposition: A | Discharge status: Home / Self Care | Payer: PRIVATE HEALTH INSURANCE | Source: Ambulatory Visit | Attending: Obstetrics and Gynecology | Admitting: Obstetrics and Gynecology

## 2021-06-07 ENCOUNTER — Other Ambulatory Visit: Payer: Self-pay

## 2021-06-07 DIAGNOSIS — Z1231 Encounter for screening mammogram for malignant neoplasm of breast: Secondary | ICD-10-CM

## 2021-10-11 ENCOUNTER — Ambulatory Visit: Payer: PRIVATE HEALTH INSURANCE | Admitting: Obstetrics and Gynecology

## 2021-10-11 ENCOUNTER — Encounter: Payer: Self-pay | Admitting: Obstetrics and Gynecology

## 2021-10-11 ENCOUNTER — Ambulatory Visit (INDEPENDENT_AMBULATORY_CARE_PROVIDER_SITE_OTHER): Payer: 59 | Admitting: Obstetrics and Gynecology

## 2021-10-11 ENCOUNTER — Other Ambulatory Visit: Payer: Self-pay

## 2021-10-11 VITALS — BP 142/80 | HR 82 | Ht 64.0 in | Wt 144.0 lb

## 2021-10-11 DIAGNOSIS — Z01419 Encounter for gynecological examination (general) (routine) without abnormal findings: Secondary | ICD-10-CM

## 2021-10-11 DIAGNOSIS — R1900 Intra-abdominal and pelvic swelling, mass and lump, unspecified site: Secondary | ICD-10-CM | POA: Diagnosis not present

## 2021-10-11 NOTE — Progress Notes (Signed)
60 y.o. G32P0002 Married Caucasian female here for annual exam.    No menopausal concerns other than difficulty of weight.   Some discomfort with intercourse but not all of the time.  Now dx of HTN and taking medication.  Grandchildren:  28 mo and 2 2 year olds.  Helping to care for them.   PCP:   Nita Sells, MD  Patient's last menstrual period was 03/24/2015 (within weeks).           Sexually active: Yes.    The current method of family planning is post menopausal status.     Hysterectomy 2018, cervix benign.  Exercising: Yes.     Walks 2-3 x/week Smoker:  no  Health Maintenance: Pap:   08-12-16 Neg:Neg HR HPV, 07-26-13 Neg:Neg HR HPV.  Final pathology from hysterectomy showed benign cervix History of abnormal Pap:  yes, Hx ?LEEP 2006 with Dr.Fore - LGSIL with positive endocervical and exocervical margins. MMG:  06-07-21  3D/Neg/Birads1 Colonoscopy:  2022 Neg Cologuard BMD:   n/a  Result  n/a TDaP: 05/2013 Gardasil:   no HIV:no Hep C:no Screening Labs: PCP Flu vaccine and Covid booster.  Shingrix:  not done.    reports that she has never smoked. She has never used smokeless tobacco. She reports that she does not drink alcohol and does not use drugs.  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   Hyperlipidemia    tx with krill oil / diet   Hypertension    Seasonal allergies     Past Surgical History:  Procedure Laterality Date   CESAREAN SECTION  03/1992   CYSTOSCOPY N/A 06/23/2017   Procedure: CYSTOSCOPY;  Surgeon: Patton Salles, MD;  Location: [facility];  Service: Gynecology;  Laterality: N/A;   DILATATION & CURETTAGE/HYSTEROSCOPY WITH MYOSURE N/A 04/14/2017   Procedure: DILATATION & CURETTAGE/HYSTEROSCOPY WITH MYOSURE Polypectomy;  Surgeon: Patton Salles, MD;  Location: [facility];  Service: Gynecology;  Laterality: N/A;  possible endometrial polyp   LEEP  2006   pathology shows CIN I with HPV effect, no  squamous intraepitheal lesion   TONSILLECTOMY     TOTAL LAPAROSCOPIC HYSTERECTOMY WITH SALPINGECTOMY Bilateral 06/23/2017   Procedure: HYSTERECTOMY TOTAL LAPAROSCOPIC WITH SALPINGECTOMY;  Surgeon: Patton Salles, MD;  Location: [facility];  Service: Gynecology;  Laterality: Bilateral;   VAGINAL DELIVERY  12/1989    Current Outpatient Medications  Medication Sig Dispense Refill   amLODipine (NORVASC) 5 MG tablet Take 1 tablet (5 mg total) by mouth daily. 30 tablet 0   atorvastatin (LIPITOR) 10 MG tablet Take 10 mg by mouth daily.     cetirizine (ZYRTEC) 10 MG tablet Take 10 mg by mouth daily.     Multiple Vitamin (MULTIVITAMIN) tablet Take 1 tablet by mouth daily.     olmesartan-hydrochlorothiazide (BENICAR HCT) 40-12.5 MG tablet Take 1 tablet by mouth daily.     Omega-3 Fatty Acids (FISH OIL) 1000 MG CAPS Take 1 capsule by mouth daily.     fluticasone (FLONASE) 50 MCG/ACT nasal spray Place 1 spray into both nostrils daily for 14 days. 16 g 0   No current facility-administered medications for this visit.    Family History  Problem Relation Age of Onset   Hypertension Mother    Hypertension Father    Diabetes Father    Hyperlipidemia Father    Obesity Brother    Obesity Sister  Hypertension Sister    Hypertension Sister    Obesity Sister    Heart failure Paternal Aunt    Heart failure Paternal Grandfather    Breast cancer Neg Hx     Review of Systems  All other systems reviewed and are negative.  Exam:   BP (!) 142/80   Pulse 82   Ht 5\' 4"  (1.626 m)   Wt 144 lb (65.3 kg)   LMP 03/24/2015 (Within Weeks)   SpO2 97%   BMI 24.72 kg/m     General appearance: alert, cooperative and appears stated age Head: normocephalic, without obvious abnormality, atraumatic Neck: no adenopathy, supple, symmetrical, trachea midline and thyroid normal to inspection and palpation Lungs: clear to auscultation bilaterally Breasts: normal appearance, no masses or  tenderness, No nipple retraction or dimpling, No nipple discharge or bleeding, No axillary adenopathy Heart: regular rate and rhythm Abdomen: soft, non-tender; no masses, no organomegaly Extremities: extremities normal, atraumatic, no cyanosis or edema Skin: skin color, texture, turgor normal. No rashes or lesions Lymph nodes: cervical, supraclavicular, and axillary nodes normal. Neurologic: grossly normal  Pelvic: External genitalia:  no lesions              No abnormal inguinal nodes palpated.              Urethra:  normal appearing urethra with no masses, tenderness or lesions              Bartholins and Skenes: normal                 Vagina: normal appearing vagina with normal color and discharge, no lesions              Cervix: absent              Pap taken:no Bimanual Exam:  Uterus:  absent.  Small midline smooth mass, nontender.               Adnexa: no mass, fullness, tenderness              Rectal exam: yes.  Confirms.              Anus:  normal sphincter tone, no lesions  Chaperone was present for exam:  05/24/2015, CMA  Assessment:   Well woman visit with gynecologic exam. Status post laparoscopic hysterectomy and bilateral salpingectomy.  Benign cervix.  Vaginal atrophy.  Pelvic mass.  Ovary versus bowel. Hx LEEP - LGSIL. HTN.  Plan: Mammogram screening discussed. Self breast awareness reviewed. Pap and HR HPV not indicated. Guidelines for Calcium, Vitamin D, regular exercise program including cardiovascular and weight bearing exercise. Vaccines discussed.  Return for pelvic Marchelle Folks. Labs with PCP.  Follow up annually and prn.   After visit summary provided.

## 2021-10-11 NOTE — Patient Instructions (Signed)

## 2021-10-25 ENCOUNTER — Other Ambulatory Visit: Payer: Self-pay

## 2021-10-25 ENCOUNTER — Other Ambulatory Visit: Payer: Self-pay | Admitting: Obstetrics and Gynecology

## 2021-10-25 ENCOUNTER — Ambulatory Visit: Payer: 59 | Admitting: Obstetrics and Gynecology

## 2021-10-25 ENCOUNTER — Ambulatory Visit (INDEPENDENT_AMBULATORY_CARE_PROVIDER_SITE_OTHER): Payer: 59

## 2021-10-25 ENCOUNTER — Encounter: Payer: Self-pay | Admitting: Obstetrics and Gynecology

## 2021-10-25 VITALS — BP 122/78 | Ht 64.25 in | Wt 144.0 lb

## 2021-10-25 DIAGNOSIS — Z01411 Encounter for gynecological examination (general) (routine) with abnormal findings: Secondary | ICD-10-CM

## 2021-10-25 DIAGNOSIS — R1907 Generalized intra-abdominal and pelvic swelling, mass and lump: Secondary | ICD-10-CM

## 2021-10-25 DIAGNOSIS — R1900 Intra-abdominal and pelvic swelling, mass and lump, unspecified site: Secondary | ICD-10-CM

## 2021-10-25 NOTE — Progress Notes (Signed)
GYNECOLOGY  VISIT   HPI: 60 y.o.   Married  Caucasian  female   G2P0002 with Patient's last menstrual period was 03/24/2015 (within weeks).   here for pelvic ultrasound.    Patient was noted to have a small midline smooth mass on routine pelvic exam on 10/12/21.  GYNECOLOGIC HISTORY: Patient's last menstrual period was 03/24/2015 (within weeks). Contraception:  Hyst Menopausal hormone therapy:  none Last mammogram: 06-07-21  3D/Neg/Birads1 Last pap smear: 08-12-16 Neg:Neg HR HPV, 07-26-13 Neg:Neg HR HPV.  Final pathology from hysterectomy showed benign cervix        OB History     Gravida  2   Para  2   Term  0   Preterm  0   AB  0   Living  2      SAB  0   IAB  0   Ectopic  0   Multiple  0   Live Births  2              Patient Active Problem List   Diagnosis Date Noted   Status post laparoscopic hysterectomy 06/23/2017    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   Hyperlipidemia    tx with krill oil / diet   Hypertension    Seasonal allergies     Past Surgical History:  Procedure Laterality Date   CESAREAN SECTION  03/1992   CYSTOSCOPY N/A 06/23/2017   Procedure: CYSTOSCOPY;  Surgeon: Patton Salles, MD;  Location: [facility];  Service: Gynecology;  Laterality: N/A;   DILATATION & CURETTAGE/HYSTEROSCOPY WITH MYOSURE N/A 04/14/2017   Procedure: DILATATION & CURETTAGE/HYSTEROSCOPY WITH MYOSURE Polypectomy;  Surgeon: Patton Salles, MD;  Location: [facility];  Service: Gynecology;  Laterality: N/A;  possible endometrial polyp   LEEP  2006   pathology shows CIN I with HPV effect, no squamous intraepitheal lesion   TONSILLECTOMY     TOTAL LAPAROSCOPIC HYSTERECTOMY WITH SALPINGECTOMY Bilateral 06/23/2017   Procedure: HYSTERECTOMY TOTAL LAPAROSCOPIC WITH SALPINGECTOMY;  Surgeon: Patton Salles, MD;  Location: [facility];  Service: Gynecology;  Laterality: Bilateral;    VAGINAL DELIVERY  12/1989    Current Outpatient Medications  Medication Sig Dispense Refill   amLODipine (NORVASC) 5 MG tablet Take 1 tablet (5 mg total) by mouth daily. 30 tablet 0   atorvastatin (LIPITOR) 10 MG tablet Take 10 mg by mouth daily.     cetirizine (ZYRTEC) 10 MG tablet Take 10 mg by mouth daily.     Multiple Vitamin (MULTIVITAMIN) tablet Take 1 tablet by mouth daily.     olmesartan-hydrochlorothiazide (BENICAR HCT) 40-12.5 MG tablet Take 1 tablet by mouth daily.     Omega-3 Fatty Acids (FISH OIL) 1000 MG CAPS Take 1 capsule by mouth daily.     fluticasone (FLONASE) 50 MCG/ACT nasal spray Place 1 spray into both nostrils daily for 14 days. 16 g 0   No current facility-administered medications for this visit.     ALLERGIES: Tetracyclines & related  Family History  Problem Relation Age of Onset   Hypertension Mother    Hypertension Father    Diabetes Father    Hyperlipidemia Father    Obesity Brother    Obesity Sister    Hypertension Sister    Hypertension Sister    Obesity Sister    Heart failure Paternal Aunt    Heart failure Paternal Grandfather  Breast cancer Neg Hx     Social History   Socioeconomic History   Marital status: Married    Spouse name: Not on file   Number of children: 2   Years of education: Not on file   Highest education level: Not on file  Occupational History   Not on file  Tobacco Use   Smoking status: Never   Smokeless tobacco: Never  Vaping Use   Vaping Use: Never used  Substance and Sexual Activity   Alcohol use: No   Drug use: No   Sexual activity: Yes    Partners: Male    Birth control/protection: Surgical, Post-menopausal    Comment: husband vasectomy/Hyst  Other Topics Concern   Not on file  Social History Narrative   Not on file   Social Determinants of Health   Financial Resource Strain: Not on file  Food Insecurity: Not on file  Transportation Needs: Not on file  Physical Activity: Not on file  Stress:  Not on file  Social Connections: Not on file  Intimate Partner Violence: Not on file    Review of Systems  All other systems reviewed and are negative.  PHYSICAL EXAMINATION:    BP 122/78   Ht 5' 4.25" (1.632 m)   Wt 144 lb (65.3 kg)   LMP 03/24/2015 (Within Weeks)   BMI 24.53 kg/m     General appearance: alert, cooperative and appears stated age   Pelvic US - transvaginal and transabdominal  Uterus absent.  Ovaries normal. Midline shadowing bowel gas seen.  No mass.  No free fluid.  ASSESSMENT  Abnormal pelvic exam. Pelvic mass on exam.  Appears to be bowel.  Normal pelvic ultrasound.  PLAN  Reassurance regarding pelvic ultrasound findings.  Images were printed and later reviewed as they were not available at the time of the visit.  They are now available in Epic. FU for pelvic pain, bloating, or pressure and prn.    An After Visit Summary was printed and given to the patient.

## 2022-05-13 ENCOUNTER — Other Ambulatory Visit (HOSPITAL_COMMUNITY): Payer: Self-pay | Admitting: Obstetrics and Gynecology

## 2022-05-13 DIAGNOSIS — Z1231 Encounter for screening mammogram for malignant neoplasm of breast: Secondary | ICD-10-CM

## 2022-06-14 ENCOUNTER — Ambulatory Visit (HOSPITAL_COMMUNITY)
Admission: RE | Admit: 2022-06-14 | Discharge: 2022-06-14 | Disposition: A | Discharge status: Home / Self Care | Payer: 59 | Source: Ambulatory Visit | Attending: Obstetrics and Gynecology | Admitting: Obstetrics and Gynecology

## 2022-06-14 DIAGNOSIS — Z1231 Encounter for screening mammogram for malignant neoplasm of breast: Secondary | ICD-10-CM | POA: Insufficient documentation

## 2022-10-17 ENCOUNTER — Ambulatory Visit: Payer: 59 | Admitting: Obstetrics and Gynecology

## 2022-11-05 DIAGNOSIS — L814 Other melanin hyperpigmentation: Secondary | ICD-10-CM | POA: Diagnosis not present

## 2022-11-05 DIAGNOSIS — L218 Other seborrheic dermatitis: Secondary | ICD-10-CM | POA: Diagnosis not present

## 2022-11-05 DIAGNOSIS — D492 Neoplasm of unspecified behavior of bone, soft tissue, and skin: Secondary | ICD-10-CM | POA: Diagnosis not present

## 2022-11-05 DIAGNOSIS — L821 Other seborrheic keratosis: Secondary | ICD-10-CM | POA: Diagnosis not present

## 2022-11-05 DIAGNOSIS — L84 Corns and callosities: Secondary | ICD-10-CM | POA: Diagnosis not present

## 2022-11-05 DIAGNOSIS — Z08 Encounter for follow-up examination after completed treatment for malignant neoplasm: Secondary | ICD-10-CM | POA: Diagnosis not present

## 2022-11-05 DIAGNOSIS — D225 Melanocytic nevi of trunk: Secondary | ICD-10-CM | POA: Diagnosis not present

## 2022-11-05 DIAGNOSIS — Z86007 Personal history of in-situ neoplasm of skin: Secondary | ICD-10-CM | POA: Diagnosis not present

## 2022-11-05 DIAGNOSIS — L720 Epidermal cyst: Secondary | ICD-10-CM | POA: Diagnosis not present

## 2022-11-07 ENCOUNTER — Encounter: Payer: Self-pay | Admitting: Obstetrics and Gynecology

## 2022-11-07 ENCOUNTER — Ambulatory Visit (INDEPENDENT_AMBULATORY_CARE_PROVIDER_SITE_OTHER): Payer: 59 | Admitting: Obstetrics and Gynecology

## 2022-11-07 VITALS — BP 122/80 | Ht 64.5 in | Wt 145.0 lb

## 2022-11-07 DIAGNOSIS — Z01419 Encounter for gynecological examination (general) (routine) without abnormal findings: Secondary | ICD-10-CM

## 2022-11-07 NOTE — Patient Instructions (Signed)

## 2022-11-07 NOTE — Progress Notes (Signed)
61 y.o. G72P0002 Married Caucasian female here for annual exam.    No concerns today.  Has 4 grandchildren now.  Renovating a bathroom at home.  PCP:   Allyn Kenner, MD  Patient's last menstrual period was 03/24/2015 (within weeks).           Sexually active: Yes.    The current method of family planning is post menopausal status.    Exercising: Yes.    Home exercise routine includes walking 2 hrs per week. Smoker:  no  Health Maintenance: Pap:  08-12-16 Neg:Neg HR HPV, 07-26-13 Neg:Neg HR HPV.  Final pathology from hysterectomy showed benign cervix  History of abnormal Pap:  yes, Hx ?LEEP 2006 with Dr.Fore - LGSIL with positive endocervical and exocervical margins.  MMG:  06/14/22 BI-RADS neg 1 Colonoscopy:  2021 Neg Cologuard - PCP BMD:  n/a TDaP:  05/2013 Gardasil:   no Screening Labs:  PCP. Shingrix/flu vaccine/Covid/RSV vaccines discussed.   reports that she has never smoked. She has never used smokeless tobacco. She reports that she does not drink alcohol and does not use drugs.  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   Hyperlipidemia    tx with krill oil / diet   Hypertension    Seasonal allergies     Past Surgical History:  Procedure Laterality Date   CESAREAN SECTION  03/1992   CYSTOSCOPY N/A 06/23/2017   Procedure: CYSTOSCOPY;  Surgeon: Nunzio Cobbs, MD;  Location: [facility];  Service: Gynecology;  Laterality: N/A;   DILATATION & CURETTAGE/HYSTEROSCOPY WITH MYOSURE N/A 04/14/2017   Procedure: DILATATION & CURETTAGE/HYSTEROSCOPY WITH MYOSURE Polypectomy;  Surgeon: Nunzio Cobbs, MD;  Location: [facility];  Service: Gynecology;  Laterality: N/A;  possible endometrial polyp   LEEP  2006   pathology shows CIN I with HPV effect, no squamous intraepitheal lesion   TONSILLECTOMY     TOTAL LAPAROSCOPIC HYSTERECTOMY WITH SALPINGECTOMY Bilateral 06/23/2017   Procedure: HYSTERECTOMY TOTAL LAPAROSCOPIC WITH  SALPINGECTOMY;  Surgeon: Nunzio Cobbs, MD;  Location: [facility];  Service: Gynecology;  Laterality: Bilateral;   VAGINAL DELIVERY  12/1989    Current Outpatient Medications  Medication Sig Dispense Refill   atorvastatin (LIPITOR) 20 MG tablet Take 20 mg by mouth daily.     olmesartan-hydrochlorothiazide (BENICAR HCT) 20-12.5 MG tablet Take 1 tablet by mouth daily.     amLODipine (NORVASC) 5 MG tablet Take 1 tablet (5 mg total) by mouth daily. 30 tablet 0   cetirizine (ZYRTEC) 10 MG tablet Take 10 mg by mouth daily.     Multiple Vitamin (MULTIVITAMIN) tablet Take 1 tablet by mouth daily.     Omega-3 Fatty Acids (FISH OIL) 1000 MG CAPS Take 1 capsule by mouth daily.     No current facility-administered medications for this visit.    Family History  Problem Relation Age of Onset   Hypertension Mother    Hypertension Father    Diabetes Father    Hyperlipidemia Father    Obesity Brother    Obesity Sister    Hypertension Sister    Hypertension Sister    Obesity Sister    Heart failure Paternal Aunt    Heart failure Paternal Grandfather    Breast cancer Neg Hx     Review of Systems  All other systems reviewed and are negative.   Exam:   BP 122/80 (BP Location: Left Arm, Patient Position:  Sitting, Cuff Size: Normal)   Ht 5' 4.5" (1.638 m)   Wt 145 lb (65.8 kg)   LMP 03/24/2015 (Within Weeks)   BMI 24.50 kg/m     General appearance: alert, cooperative and appears stated age Head: normocephalic, without obvious abnormality, atraumatic Neck: no adenopathy, supple, symmetrical, trachea midline and thyroid normal to inspection and palpation Lungs: clear to auscultation bilaterally Breasts: normal appearance, no masses or tenderness, No nipple retraction or dimpling, No nipple discharge or bleeding, No axillary adenopathy Heart: regular rate and rhythm Abdomen: soft, non-tender; no masses, no organomegaly Extremities: extremities normal, atraumatic, no  cyanosis or edema Skin: skin color, texture, turgor normal. No rashes or lesions Lymph nodes: cervical, supraclavicular, and axillary nodes normal. Neurologic: grossly normal  Pelvic: External genitalia:  no lesions              No abnormal inguinal nodes palpated.              Urethra:  normal appearing urethra with no masses, tenderness or lesions              Bartholins and Skenes: normal                 Vagina: normal appearing vagina with normal color and discharge, no lesions              Cervix: absent              Pap taken: no Bimanual Exam:  Uterus:  absent              Adnexa: no mass, fullness, tenderness              Rectal exam: yes.  Confirms.              Anus:  normal sphincter tone, no lesions  Chaperone was present for exam:  Irving Burton  Assessment:   Well woman visit with gynecologic exam. Status post laparoscopic hysterectomy and bilateral salpingectomy.  Benign cervix.  Hx LEEP - LGSIL. HTN.  Plan: Mammogram screening discussed. Self breast awareness reviewed. Pap and HR HPV as above. Guidelines for Calcium, Vitamin D, regular exercise program including cardiovascular and weight bearing exercise. Labs with PCP.  Follow up annually and prn.   After visit summary provided.

## 2022-12-20 DIAGNOSIS — H6692 Otitis media, unspecified, left ear: Secondary | ICD-10-CM | POA: Diagnosis not present

## 2023-02-28 DIAGNOSIS — I10 Essential (primary) hypertension: Secondary | ICD-10-CM | POA: Diagnosis not present

## 2023-03-07 DIAGNOSIS — R944 Abnormal results of kidney function studies: Secondary | ICD-10-CM | POA: Diagnosis not present

## 2023-03-07 DIAGNOSIS — Z79899 Other long term (current) drug therapy: Secondary | ICD-10-CM | POA: Diagnosis not present

## 2023-03-07 DIAGNOSIS — Z6823 Body mass index (BMI) 23.0-23.9, adult: Secondary | ICD-10-CM | POA: Diagnosis not present

## 2023-03-07 DIAGNOSIS — I10 Essential (primary) hypertension: Secondary | ICD-10-CM | POA: Diagnosis not present

## 2023-03-07 DIAGNOSIS — Z713 Dietary counseling and surveillance: Secondary | ICD-10-CM | POA: Diagnosis not present

## 2023-03-07 DIAGNOSIS — R7303 Prediabetes: Secondary | ICD-10-CM | POA: Diagnosis not present

## 2023-03-07 DIAGNOSIS — E782 Mixed hyperlipidemia: Secondary | ICD-10-CM | POA: Diagnosis not present

## 2023-05-20 ENCOUNTER — Other Ambulatory Visit (HOSPITAL_COMMUNITY): Payer: Self-pay | Admitting: Obstetrics and Gynecology

## 2023-05-20 DIAGNOSIS — Z1231 Encounter for screening mammogram for malignant neoplasm of breast: Secondary | ICD-10-CM

## 2023-06-25 ENCOUNTER — Encounter (HOSPITAL_COMMUNITY): Payer: Self-pay

## 2023-06-25 ENCOUNTER — Ambulatory Visit (HOSPITAL_COMMUNITY)
Admission: RE | Admit: 2023-06-25 | Discharge: 2023-06-25 | Disposition: A | Discharge status: Home / Self Care | Payer: 59 | Source: Ambulatory Visit | Attending: Obstetrics and Gynecology | Admitting: Obstetrics and Gynecology

## 2023-06-25 DIAGNOSIS — Z1231 Encounter for screening mammogram for malignant neoplasm of breast: Secondary | ICD-10-CM | POA: Insufficient documentation

## 2023-08-29 DIAGNOSIS — I10 Essential (primary) hypertension: Secondary | ICD-10-CM | POA: Diagnosis not present

## 2023-09-05 DIAGNOSIS — E782 Mixed hyperlipidemia: Secondary | ICD-10-CM | POA: Diagnosis not present

## 2023-09-05 DIAGNOSIS — Z79899 Other long term (current) drug therapy: Secondary | ICD-10-CM | POA: Diagnosis not present

## 2023-09-05 DIAGNOSIS — R7303 Prediabetes: Secondary | ICD-10-CM | POA: Diagnosis not present

## 2023-09-05 DIAGNOSIS — I10 Essential (primary) hypertension: Secondary | ICD-10-CM | POA: Diagnosis not present

## 2023-09-05 DIAGNOSIS — R944 Abnormal results of kidney function studies: Secondary | ICD-10-CM | POA: Diagnosis not present

## 2023-11-07 DIAGNOSIS — B351 Tinea unguium: Secondary | ICD-10-CM | POA: Diagnosis not present

## 2023-11-07 DIAGNOSIS — D485 Neoplasm of uncertain behavior of skin: Secondary | ICD-10-CM | POA: Diagnosis not present

## 2023-11-07 DIAGNOSIS — D225 Melanocytic nevi of trunk: Secondary | ICD-10-CM | POA: Diagnosis not present

## 2023-11-07 DIAGNOSIS — L814 Other melanin hyperpigmentation: Secondary | ICD-10-CM | POA: Diagnosis not present

## 2023-11-07 DIAGNOSIS — L821 Other seborrheic keratosis: Secondary | ICD-10-CM | POA: Diagnosis not present

## 2023-11-28 ENCOUNTER — Encounter: Payer: Self-pay | Admitting: Obstetrics and Gynecology

## 2023-11-28 ENCOUNTER — Ambulatory Visit (INDEPENDENT_AMBULATORY_CARE_PROVIDER_SITE_OTHER): Payer: 59 | Admitting: Obstetrics and Gynecology

## 2023-11-28 VITALS — BP 182/80 | HR 84 | Ht 64.57 in | Wt 142.0 lb

## 2023-11-28 DIAGNOSIS — N951 Menopausal and female climacteric states: Secondary | ICD-10-CM

## 2023-11-28 DIAGNOSIS — Z01419 Encounter for gynecological examination (general) (routine) without abnormal findings: Secondary | ICD-10-CM | POA: Diagnosis not present

## 2023-11-28 DIAGNOSIS — I10 Essential (primary) hypertension: Secondary | ICD-10-CM | POA: Diagnosis not present

## 2023-11-28 DIAGNOSIS — E785 Hyperlipidemia, unspecified: Secondary | ICD-10-CM | POA: Insufficient documentation

## 2023-11-28 NOTE — Progress Notes (Signed)
62 y.o. G20P0002 female s/p TLH here for annual exam. Married. Watching grandchildren 1-4yo.  Patient's last menstrual period was 03/24/2015 (within weeks).   States that she didn't take her BP medication this morning. She usually takes morning and night. Denies HA, CP, SOB.  Abnormal bleeding: none Pelvic discharge or pain: none Breast mass, nipple discharge or skin changes : none Last PAP: 2017 NIL, HPV neg Last mammogram: 06/25/2023 BIRADS 1, density a Last colonoscopy: 2021 Neg Cologuard - PCP  Sexually active: yes, notes dryness with intercourse, using lubricant Exercising: yes, walking Smoker: no  GYN HISTORY: 2006 Hx ?LEEP with Dr.Fore - LGSIL with positive endocervical and exocervical margins.  2018 Benign TLH   OB History  Gravida Para Term Preterm AB Living  2 2 0 0 0 2  SAB IAB Ectopic Multiple Live Births  0 0 0 0 2    # Outcome Date GA Lbr Len/2nd Weight Sex Type Anes PTL Lv  2 Para 03/1992    M CS-Unspec   LIV  1 Para 12/1989    M Vag-Spont   LIV    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   Hyperlipidemia    tx with krill oil / diet   Hypertension    Seasonal allergies     Past Surgical History:  Procedure Laterality Date   CESAREAN SECTION  03/1992   CYSTOSCOPY N/A 06/23/2017   Procedure: CYSTOSCOPY;  Surgeon: Patton Salles, MD;  Location: [facility];  Service: Gynecology;  Laterality: N/A;   DILATATION & CURETTAGE/HYSTEROSCOPY WITH MYOSURE N/A 04/14/2017   Procedure: DILATATION & CURETTAGE/HYSTEROSCOPY WITH MYOSURE Polypectomy;  Surgeon: Patton Salles, MD;  Location: [facility];  Service: Gynecology;  Laterality: N/A;  possible endometrial polyp   LEEP  2006   pathology shows CIN I with HPV effect, no squamous intraepitheal lesion   TONSILLECTOMY     TOTAL LAPAROSCOPIC HYSTERECTOMY WITH SALPINGECTOMY Bilateral 06/23/2017   Procedure: HYSTERECTOMY TOTAL LAPAROSCOPIC WITH SALPINGECTOMY;   Surgeon: Patton Salles, MD;  Location: [facility];  Service: Gynecology;  Laterality: Bilateral;   VAGINAL DELIVERY  12/1989    Current Outpatient Medications on File Prior to Visit  Medication Sig Dispense Refill   amLODipine (NORVASC) 5 MG tablet Take 1 tablet (5 mg total) by mouth daily. 30 tablet 0   atorvastatin (LIPITOR) 20 MG tablet Take 20 mg by mouth daily.     cetirizine (ZYRTEC) 10 MG tablet Take 10 mg by mouth daily.     Multiple Vitamin (MULTIVITAMIN) tablet Take 1 tablet by mouth daily.     olmesartan-hydrochlorothiazide (BENICAR HCT) 20-12.5 MG tablet Take 1 tablet by mouth daily.     No current facility-administered medications on file prior to visit.    Social History   Socioeconomic History   Marital status: Married    Spouse name: Not on file   Number of children: 2   Years of education: Not on file   Highest education level: Not on file  Occupational History   Not on file  Tobacco Use   Smoking status: Never   Smokeless tobacco: Never  Vaping Use   Vaping status: Never Used  Substance and Sexual Activity   Alcohol use: No   Drug use: No   Sexual activity: Yes    Partners: Male    Birth control/protection: Surgical, Post-menopausal    Comment: husband vasectomy/Hyst  Other Topics Concern   Not on file  Social History Narrative   Not on file   Social Determinants of Health   Financial Resource Strain: Not on file  Food Insecurity: Not on file  Transportation Needs: Not on file  Physical Activity: Not on file  Stress: Not on file  Social Connections: Not on file  Intimate Partner Violence: Not on file    Family History  Problem Relation Age of Onset   Hypertension Mother    Hypertension Father    Diabetes Father    Hyperlipidemia Father    Obesity Brother    Obesity Sister    Hypertension Sister    Hypertension Sister    Obesity Sister    Heart failure Paternal Aunt    Heart failure Paternal Grandfather     Breast cancer Neg Hx     Allergies  Allergen Reactions   Tetracyclines & Related Other (See Comments)    unknown      PE Today's Vitals   11/28/23 0950 11/28/23 1028  BP: (!) 160/78 (!) 182/80  Pulse: 84   SpO2: 99%   Weight: 142 lb (64.4 kg)   Height: 5' 4.57" (1.64 m)    Body mass index is 23.95 kg/m.  Physical Exam Vitals reviewed. Exam conducted with a chaperone present.  Constitutional:      General: She is not in acute distress.    Appearance: Normal appearance.  HENT:     Head: Normocephalic and atraumatic.     Nose: Nose normal.  Eyes:     Extraocular Movements: Extraocular movements intact.     Conjunctiva/sclera: Conjunctivae normal.  Neck:     Thyroid: No thyroid mass, thyromegaly or thyroid tenderness.  Pulmonary:     Effort: Pulmonary effort is normal.  Chest:     Chest wall: No mass or tenderness.  Breasts:    Right: Normal. No swelling, mass, nipple discharge or tenderness.     Left: Normal. No swelling, mass, nipple discharge or tenderness.  Abdominal:     General: There is no distension.     Palpations: Abdomen is soft.     Tenderness: There is no abdominal tenderness.  Genitourinary:    General: Normal vulva.     Exam position: Lithotomy position.     Urethra: No prolapse.     Vagina: Normal. No vaginal discharge or bleeding.     Cervix: No lesion.     Adnexa: Right adnexa normal and left adnexa normal.     Comments: Cervix and uterus absent Musculoskeletal:        General: Normal range of motion.     Cervical back: Normal range of motion.  Lymphadenopathy:     Upper Body:     Right upper body: No axillary adenopathy.     Left upper body: No axillary adenopathy.     Lower Body: No right inguinal adenopathy. No left inguinal adenopathy.  Skin:    General: Skin is warm and dry.  Neurological:     General: No focal deficit present.     Mental Status: She is alert.  Psychiatric:        Mood and Affect: Mood normal.        Behavior:  Behavior normal.       Assessment and Plan:        Well woman exam with routine gynecological exam Assessment & Plan: Cervical cancer screening performed according to ASCCP guidelines. Encouraged annual mammogram screening Colonoscopy UTD DXA N/A Labs and  immunizations with her primary Encouraged safe sexual practices as indicated Encouraged healthy lifestyle practices with diet and exercise For patients under 50-70yo, I recommend 1200mg  calcium daily and 600IU of vitamin D daily.    Primary hypertension Assessment & Plan: Asymptomatic Patient to take medications at home. Husband will drive home. Call PCP with persistent elevations.   Vaginal dryness, menopausal  Considering applying coconut oil to the vulva after showers. You could also using daily vaginal moisturizers by brands like Good Clean Love and Ah! Yes.   Rosalyn Gess, MD

## 2023-11-28 NOTE — Assessment & Plan Note (Signed)
 Cervical cancer screening performed according to ASCCP guidelines. Encouraged annual mammogram screening Colonoscopy UTD DXA N/A Labs and immunizations with her primary Encouraged safe sexual practices as indicated Encouraged healthy lifestyle practices with diet and exercise For patients under 50-60yo, I recommend 1200mg  calcium daily and 600IU of vitamin D daily.

## 2023-11-28 NOTE — Assessment & Plan Note (Signed)
Asymptomatic Patient to take medications at home. Husband will drive home. Call PCP with persistent elevations.

## 2023-11-28 NOTE — Patient Instructions (Addendum)
Considering applying coconut oil to the vulva after showers. You could also using daily vaginal moisturizers by brands like Good Clean Love and Ah! Yes.  For patients under 50-60yo, I recommend 1200mg  calcium daily and 600IU of vitamin D daily.  Health Maintenance, Female Adopting a healthy lifestyle and getting preventive care are important in promoting health and wellness. Ask your health care provider about: The right schedule for you to have regular tests and exams. Things you can do on your own to prevent diseases and keep yourself healthy. What should I know about diet, weight, and exercise? Eat a healthy diet  Eat a diet that includes plenty of vegetables, fruits, low-fat dairy products, and lean protein. Do not eat a lot of foods that are high in solid fats, added sugars, or sodium. Maintain a healthy weight Body mass index (BMI) is used to identify weight problems. It estimates body fat based on height and weight. Your health care provider can help determine your BMI and help you achieve or maintain a healthy weight. Get regular exercise Get regular exercise. This is one of the most important things you can do for your health. Most adults should: Exercise for at least 150 minutes each week. The exercise should increase your heart rate and make you sweat (moderate-intensity exercise). Do strengthening exercises at least twice a week. This is in addition to the moderate-intensity exercise. Spend less time sitting. Even light physical activity can be beneficial. Watch cholesterol and blood lipids Have your blood tested for lipids and cholesterol at 60 years of age, then have this test every 5 years. Have your cholesterol levels checked more often if: Your lipid or cholesterol levels are high. You are older than 60 years of age. You are at high risk for heart disease. What should I know about cancer screening? Depending on your health history and family history, you may need to have  cancer screening at various ages. This may include screening for: Breast cancer. Cervical cancer. Colorectal cancer. Skin cancer. Lung cancer. What should I know about heart disease, diabetes, and high blood pressure? Blood pressure and heart disease High blood pressure causes heart disease and increases the risk of stroke. This is more likely to develop in people who have high blood pressure readings or are overweight. Have your blood pressure checked: Every 3-5 years if you are 6-63 years of age. Every year if you are 49 years old or older. Diabetes Have regular diabetes screenings. This checks your fasting blood sugar level. Have the screening done: Once every three years after age 8 if you are at a normal weight and have a low risk for diabetes. More often and at a younger age if you are overweight or have a high risk for diabetes. What should I know about preventing infection? Hepatitis B If you have a higher risk for hepatitis B, you should be screened for this virus. Talk with your health care provider to find out if you are at risk for hepatitis B infection. Hepatitis C Testing is recommended for: Everyone born from [birth year] through 1963. Anyone with known risk factors for hepatitis C. Sexually transmitted infections (STIs) Get screened for STIs, including gonorrhea and chlamydia, if: You are sexually active and are younger than 60 years of age. You are older than 60 years of age and your health care provider tells you that you are at risk for this type of infection. Your sexual activity has changed since you were last screened, and you are at increased  risk for chlamydia or gonorrhea. Ask your health care provider if you are at risk. Ask your health care provider about whether you are at high risk for HIV. Your health care provider may recommend a prescription medicine to help prevent HIV infection. If you choose to take medicine to prevent HIV, you should first get tested for HIV.  You should then be tested every 3 months for as long as you are taking the medicine. Osteoporosis and menopause Osteoporosis is a disease in which the bones lose minerals and strength with aging. This can result in bone fractures. If you are 50 years old or older, or if you are at risk for osteoporosis and fractures, ask your health care provider if you should: Be screened for bone loss. Take a calcium or vitamin D supplement to lower your risk of fractures. Be given hormone replacement therapy (HRT) to treat symptoms of menopause. Follow these instructions at home: Alcohol use Do not drink alcohol if: Your health care provider tells you not to drink. You are pregnant, may be pregnant, or are planning to become pregnant. If you drink alcohol: Limit how much you have to: 0-1 drink a day. Know how much alcohol is in your drink. In the [place] one drink equals one 12 oz bottle of beer (355 mL), one 5 oz glass of wine (148 mL), or one 1 oz glass of hard liquor (44 mL). Lifestyle Do not use any products that contain nicotine or tobacco. These products include cigarettes, chewing tobacco, and vaping devices, such as e-cigarettes. If you need help quitting, ask your health care provider. Do not use street drugs. Do not share needles. Ask your health care provider for help if you need support or information about quitting drugs. General instructions Schedule regular health, dental, and eye exams. Stay current with your vaccines. Tell your health care provider if: You often feel depressed. You have ever been abused or do not feel safe at home. Summary Adopting a healthy lifestyle and getting preventive care are important in promoting health and wellness. Follow your health care provider's instructions about healthy diet, exercising, and getting tested or screened for diseases. Follow your health care provider's instructions on monitoring your cholesterol and blood pressure. This information is  not intended to replace advice given to you by your health care provider. Make sure you discuss any questions you have with your health care provider. Document Revised: 04/30/2021 Document Reviewed: 04/30/2021 Elsevier Patient Education  2024 [employer].

## 2023-12-26 DIAGNOSIS — C44319 Basal cell carcinoma of skin of other parts of face: Secondary | ICD-10-CM | POA: Diagnosis not present

## 2024-02-02 ENCOUNTER — Ambulatory Visit: Payer: 59 | Admitting: Obstetrics and Gynecology

## 2024-02-27 DIAGNOSIS — I10 Essential (primary) hypertension: Secondary | ICD-10-CM | POA: Diagnosis not present

## 2024-04-02 DIAGNOSIS — Z85828 Personal history of other malignant neoplasm of skin: Secondary | ICD-10-CM | POA: Diagnosis not present

## 2024-04-02 DIAGNOSIS — D2262 Melanocytic nevi of left upper limb, including shoulder: Secondary | ICD-10-CM | POA: Diagnosis not present

## 2024-04-02 DIAGNOSIS — L821 Other seborrheic keratosis: Secondary | ICD-10-CM | POA: Diagnosis not present

## 2024-04-02 DIAGNOSIS — Z08 Encounter for follow-up examination after completed treatment for malignant neoplasm: Secondary | ICD-10-CM | POA: Diagnosis not present

## 2024-04-02 DIAGNOSIS — D225 Melanocytic nevi of trunk: Secondary | ICD-10-CM | POA: Diagnosis not present

## 2024-04-02 DIAGNOSIS — L814 Other melanin hyperpigmentation: Secondary | ICD-10-CM | POA: Diagnosis not present

## 2024-04-02 DIAGNOSIS — B351 Tinea unguium: Secondary | ICD-10-CM | POA: Diagnosis not present

## 2024-05-04 LAB — COLOGUARD: COLOGUARD: POSITIVE — AB

## 2024-05-11 ENCOUNTER — Other Ambulatory Visit (HOSPITAL_COMMUNITY): Payer: Self-pay | Admitting: Obstetrics and Gynecology

## 2024-05-11 DIAGNOSIS — Z1231 Encounter for screening mammogram for malignant neoplasm of breast: Secondary | ICD-10-CM

## 2024-05-20 NOTE — H&P (View-Only) (Signed)
 Referring Provider: Joeann Browning, FNP  Primary Care Physician:  Shona Norleen PEDLAR, MD Primary Gastroenterologist:  Dr. Shaaron  Chief Complaint  Patient presents with   postivie cologuard     Positive cologuard test     HPI:   Zoe Sherman is a 63 y.o. female presenting today at the request of Joeann Browning, FNP for positive cologuard.   Cologuard was positive 04/29/24  Labs included in referral dated 02/29/2024 with hemoglobin 13.6, normocytic indices  Slight elevation of creatinine at 1.04.  LFTs and electrolytes within normal limits.  Today:  Occasionally with a little red blood in her stool. When this happens, its after she has had a couple bowel movements. Not frequent. Started couple months ago. Typically has 1-2 Bms per day that are soft. No diarrhea or constipation.    No abdominal pain, rectal pain, unintentional weight loss, melena.   Occasional indigestion if eating something spicy, but started famotidine prior to dietary triggers and symptoms resolved.  No dysphagia, nausea, vomiting.  Was taking ibuprofen  regularly a couple months ago for allergies.   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   Hyperlipidemia    tx with krill oil / diet   Hypertension    Seasonal allergies     Past Surgical History:  Procedure Laterality Date   CESAREAN SECTION  03/1992   CYSTOSCOPY N/A 06/23/2017   Procedure: CYSTOSCOPY;  Surgeon: Cathlyn JAYSON Nikki Bobie FORBES, MD;  Location: [facility];  Service: Gynecology;  Laterality: N/A;   DILATATION & CURETTAGE/HYSTEROSCOPY WITH MYOSURE N/A 04/14/2017   Procedure: DILATATION & CURETTAGE/HYSTEROSCOPY WITH MYOSURE Polypectomy;  Surgeon: Bobie FORBES Cathlyn JAYSON Nikki, MD;  Location: [facility];  Service: Gynecology;  Laterality: N/A;  possible endometrial polyp   LEEP  2006   pathology shows CIN I with HPV effect, no squamous intraepitheal lesion   TONSILLECTOMY     TOTAL LAPAROSCOPIC HYSTERECTOMY WITH  SALPINGECTOMY Bilateral 06/23/2017   Procedure: HYSTERECTOMY TOTAL LAPAROSCOPIC WITH SALPINGECTOMY;  Surgeon: Cathlyn JAYSON Nikki Bobie FORBES, MD;  Location: [facility];  Service: Gynecology;  Laterality: Bilateral;   VAGINAL DELIVERY  12/1989    Current Outpatient Medications  Medication Sig Dispense Refill   amLODipine  (NORVASC ) 5 MG tablet Take 1 tablet (5 mg total) by mouth daily. 30 tablet 0   atorvastatin (LIPITOR) 20 MG tablet Take 20 mg by mouth daily.     cetirizine (ZYRTEC) 10 MG tablet Take 10 mg by mouth daily.     olmesartan-hydrochlorothiazide (BENICAR HCT) 20-12.5 MG tablet Take 1 tablet by mouth daily.     No current facility-administered medications for this visit.    Allergies as of 05/21/2024 - Review Complete 05/21/2024  Allergen Reaction Noted   Tetracyclines & related Other (See Comments) 07/22/2013    Family History  Problem Relation Age of Onset   Hypertension Mother    Hypertension Father    Diabetes Father    Hyperlipidemia Father    Obesity Sister    Hypertension Sister    Hypertension Sister    Obesity Sister    Obesity Brother    Heart failure Paternal Grandfather    Heart failure Paternal Aunt    Breast cancer Neg Hx    Colon cancer Neg Hx     Social History   Socioeconomic History   Marital status: Married    Spouse name: Not on file   Number of children: 2  Years of education: Not on file   Highest education level: Not on file  Occupational History   Not on file  Tobacco Use   Smoking status: Never   Smokeless tobacco: Never  Vaping Use   Vaping status: Never Used  Substance and Sexual Activity   Alcohol use: No   Drug use: No   Sexual activity: Yes    Partners: Male    Birth control/protection: Surgical, Post-menopausal    Comment: husband vasectomy/Hyst  Other Topics Concern   Not on file  Social History Narrative   Not on file   Social Drivers of Health   Financial Resource Strain: Not on file  Food  Insecurity: Not on file  Transportation Needs: Not on file  Physical Activity: Not on file  Stress: Not on file  Social Connections: Not on file  Intimate Partner Violence: Not on file    Review of Systems: Gen: Denies any fever, chills, cold or flulike symptoms, presyncope, syncope. CV: Denies chest pain, heart palpitations. Resp: Denies shortness of breath, cough. GI: See HPI  GU : Denies urinary burning, urinary frequency, urinary hesitancy MS: Denies joint pain. Derm: Denies rash. Psych: Denies depression, anxiety. Heme: See HPI  Physical Exam: BP 139/66 (BP Location: Right Arm, Patient Position: Sitting, Cuff Size: Normal)   Pulse 97   Temp (!) 96.9 F (36.1 C) (Temporal)   Ht 5' 5 (1.651 m)   Wt 144 lb 12.8 oz (65.7 kg)   LMP 03/24/2015 (Within Weeks)   BMI 24.10 kg/m  General:   Alert and oriented. Pleasant and cooperative. Well-nourished and well-developed.  Head:  Normocephalic and atraumatic. Eyes:  Without icterus, sclera clear and conjunctiva pink.  Ears:  Normal auditory acuity. Lungs:  Clear to auscultation bilaterally. No wheezes, rales, or rhonchi. No distress.  Heart:  S1, S2 present without murmurs appreciated.  Abdomen:  +BS, soft, non-tender and non-distended. No HSM noted. No guarding or rebound. No masses appreciated.  Rectal:  Deferred  Msk:  Symmetrical without gross deformities. Normal posture. Extremities:  Without edema. Neurologic:  Alert and  oriented x4;  grossly normal neurologically. Skin:  Intact without significant lesions or rashes. Psych:  Normal mood and affect.    Assessment:  63 year old female with history of HTN, HLD, presenting today for further evaluation of positive Cologuard completed 04/29/2024.  Notably, patient also reporting very low volume intermittent rectal bleeding for the last 2 months, typically occurring if having a couple of bowel movements per day.  No other significant GI symptoms.  No unintentional weight loss.   This rectal bleeding may very well be secondary to a benign anorectal source, but unable to rule out other etiologies such as polyps or malignancy as she has never had a colonoscopy.  Recent labs with PCP showed a normal hemoglobin.   Plan:  Proceed with colonoscopy with propofol  by Dr. Shaaron in near future. The risks, benefits, and alternatives have been discussed with the patient in detail. The patient states understanding and desires to proceed.  ASA 2 BMP prior Patient is to notify me if she has any worsening rectal bleeding between now and the time of her colonoscopy. Follow-up after colonoscopy has Dr.Rourk recommends.   Josette Centers, PA-C [facility]enter[facility] 05/21/2024

## 2024-05-20 NOTE — Progress Notes (Unsigned)
 Referring Provider: Harriet Limber, FNP  Primary Care Physician:  Omie Bickers, MD Primary Gastroenterologist:  Dr. Riley Cheadle  Chief Complaint  Patient presents with   postivie cologuard     Positive cologuard test     HPI:   Zoe Sherman is a 63 y.o. female presenting today at the request of Harriet Limber, FNP for positive cologuard.   Cologuard was positive 04/29/24  Labs included in referral dated 02/29/2024 with hemoglobin 13.6, normocytic indices  Slight elevation of creatinine at 1.04.  LFTs and electrolytes within normal limits.  Today:  Occasionally with a little red blood in her stool. When this happens, its after she has had a couple bowel movements. Not frequent. Started couple months ago. Typically has 1-2 Bms per day that are soft. No diarrhea or constipation.    No abdominal pain, rectal pain, unintentional weight loss, melena.   Occasional indigestion if eating something spicy, but started famotidine prior to dietary triggers and symptoms resolved.  No dysphagia, nausea, vomiting.  Was taking ibuprofen  regularly a couple months ago for allergies.   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   Hyperlipidemia    tx with krill oil / diet   Hypertension    Seasonal allergies     Past Surgical History:  Procedure Laterality Date   CESAREAN SECTION  03/1992   CYSTOSCOPY N/A 06/23/2017   Procedure: CYSTOSCOPY;  Surgeon: Greta Leatherwood, MD;  Location: [facility];  Service: Gynecology;  Laterality: N/A;   DILATATION & CURETTAGE/HYSTEROSCOPY WITH MYOSURE N/A 04/14/2017   Procedure: DILATATION & CURETTAGE/HYSTEROSCOPY WITH MYOSURE Polypectomy;  Surgeon: Greta Leatherwood, MD;  Location: [facility];  Service: Gynecology;  Laterality: N/A;  possible endometrial polyp   LEEP  2006   pathology shows CIN I with HPV effect, no squamous intraepitheal lesion   TONSILLECTOMY     TOTAL LAPAROSCOPIC HYSTERECTOMY WITH  SALPINGECTOMY Bilateral 06/23/2017   Procedure: HYSTERECTOMY TOTAL LAPAROSCOPIC WITH SALPINGECTOMY;  Surgeon: Greta Leatherwood, MD;  Location: [facility];  Service: Gynecology;  Laterality: Bilateral;   VAGINAL DELIVERY  12/1989    Current Outpatient Medications  Medication Sig Dispense Refill   amLODipine  (NORVASC ) 5 MG tablet Take 1 tablet (5 mg total) by mouth daily. 30 tablet 0   atorvastatin (LIPITOR) 20 MG tablet Take 20 mg by mouth daily.     cetirizine (ZYRTEC) 10 MG tablet Take 10 mg by mouth daily.     olmesartan-hydrochlorothiazide (BENICAR HCT) 20-12.5 MG tablet Take 1 tablet by mouth daily.     No current facility-administered medications for this visit.    Allergies as of 05/21/2024 - Review Complete 05/21/2024  Allergen Reaction Noted   Tetracyclines & related Other (See Comments) 07/22/2013    Family History  Problem Relation Age of Onset   Hypertension Mother    Hypertension Father    Diabetes Father    Hyperlipidemia Father    Obesity Sister    Hypertension Sister    Hypertension Sister    Obesity Sister    Obesity Brother    Heart failure Paternal Grandfather    Heart failure Paternal Aunt    Breast cancer Neg Hx    Colon cancer Neg Hx     Social History   Socioeconomic History   Marital status: Married    Spouse name: Not on file   Number of children: 2  Years of education: Not on file   Highest education level: Not on file  Occupational History   Not on file  Tobacco Use   Smoking status: Never   Smokeless tobacco: Never  Vaping Use   Vaping status: Never Used  Substance and Sexual Activity   Alcohol use: No   Drug use: No   Sexual activity: Yes    Partners: Male    Birth control/protection: Surgical, Post-menopausal    Comment: husband vasectomy/Hyst  Other Topics Concern   Not on file  Social History Narrative   Not on file   Social Drivers of Health   Financial Resource Strain: Not on file  Food  Insecurity: Not on file  Transportation Needs: Not on file  Physical Activity: Not on file  Stress: Not on file  Social Connections: Not on file  Intimate Partner Violence: Not on file    Review of Systems: Gen: Denies any fever, chills, cold or flulike symptoms, presyncope, syncope. CV: Denies chest pain, heart palpitations. Resp: Denies shortness of breath, cough. GI: See HPI  GU : Denies urinary burning, urinary frequency, urinary hesitancy MS: Denies joint pain. Derm: Denies rash. Psych: Denies depression, anxiety. Heme: See HPI  Physical Exam: BP 139/66 (BP Location: Right Arm, Patient Position: Sitting, Cuff Size: Normal)   Pulse 97   Temp (!) 96.9 F (36.1 C) (Temporal)   Ht 5\' 5"  (1.651 m)   Wt 144 lb 12.8 oz (65.7 kg)   LMP 03/24/2015 (Within Weeks)   BMI 24.10 kg/m  General:   Alert and oriented. Pleasant and cooperative. Well-nourished and well-developed.  Head:  Normocephalic and atraumatic. Eyes:  Without icterus, sclera clear and conjunctiva pink.  Ears:  Normal auditory acuity. Lungs:  Clear to auscultation bilaterally. No wheezes, rales, or rhonchi. No distress.  Heart:  S1, S2 present without murmurs appreciated.  Abdomen:  +BS, soft, non-tender and non-distended. No HSM noted. No guarding or rebound. No masses appreciated.  Rectal:  Deferred  Msk:  Symmetrical without gross deformities. Normal posture. Extremities:  Without edema. Neurologic:  Alert and  oriented x4;  grossly normal neurologically. Skin:  Intact without significant lesions or rashes. Psych:  Normal mood and affect.    Assessment:  63 year old female with history of HTN, HLD, presenting today for further evaluation of positive Cologuard completed 04/29/2024.  Notably, patient also reporting very low volume intermittent rectal bleeding for the last 2 months, typically occurring if having a couple of bowel movements per day.  No other significant GI symptoms.  No unintentional weight loss.   This rectal bleeding may very well be secondary to a benign anorectal source, but unable to rule out other etiologies such as polyps or malignancy as she has never had a colonoscopy.  Recent labs with PCP showed a normal hemoglobin.   Plan:  Proceed with colonoscopy with propofol  by Dr. Riley Cheadle in near future. The risks, benefits, and alternatives have been discussed with the patient in detail. The patient states understanding and desires to proceed.  ASA 2 BMP prior Patient is to notify me if she has any worsening rectal bleeding between now and the time of her colonoscopy. Follow-up after colonoscopy has Dr.Rourk recommends.   Shana Daring, PA-C [facility] Gastroenter[facility] 05/21/2024

## 2024-05-21 ENCOUNTER — Ambulatory Visit: Admitting: Gastroenterology

## 2024-05-21 ENCOUNTER — Encounter: Payer: Self-pay | Admitting: Gastroenterology

## 2024-05-21 ENCOUNTER — Encounter: Payer: Self-pay | Admitting: *Deleted

## 2024-05-21 VITALS — BP 139/66 | HR 97 | Temp 96.9°F | Ht 65.0 in | Wt 144.8 lb

## 2024-05-21 DIAGNOSIS — K625 Hemorrhage of anus and rectum: Secondary | ICD-10-CM

## 2024-05-21 DIAGNOSIS — R195 Other fecal abnormalities: Secondary | ICD-10-CM

## 2024-05-21 NOTE — Patient Instructions (Addendum)
 Will get you scheduled for colonoscopy in the near future with Dr. Riley Cheadle at [facility].  If you have worsening rectal bleeding between now and the time of your colonoscopy, please let me know.   We will follow-up with you in the office as needed.  It was nice to meet you today!  Shana Daring, PA-C [facility]

## 2024-05-28 DIAGNOSIS — R944 Abnormal results of kidney function studies: Secondary | ICD-10-CM | POA: Diagnosis not present

## 2024-06-15 IMAGING — MG MM DIGITAL SCREENING BILAT W/ TOMO AND CAD
8 series · 9 of 24 positions shown · non-contrast
Comparison: Previous exam(s).

CLINICAL DATA: Screening.

EXAM:
DIGITAL SCREENING BILATERAL MAMMOGRAM WITH TOMOSYNTHESIS AND CAD
TECHNIQUE: Bilateral screening digital craniocaudal and mediolateral oblique
mammograms were obtained. Bilateral screening digital breast
tomosynthesis was performed. The images were evaluated with
computer-aided detection.

[L CC synth-2D]
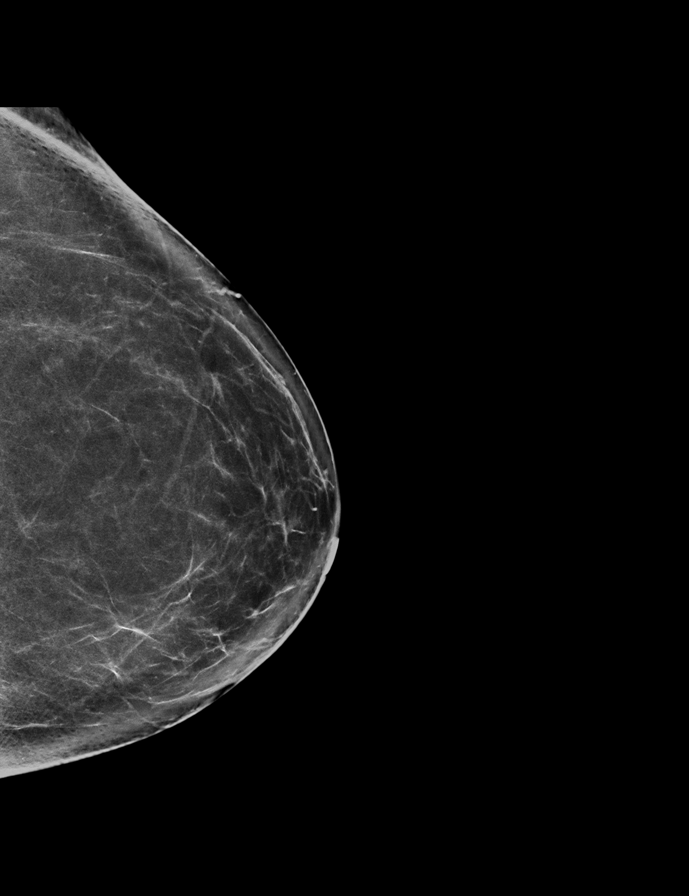

[L MLO synth-2D]
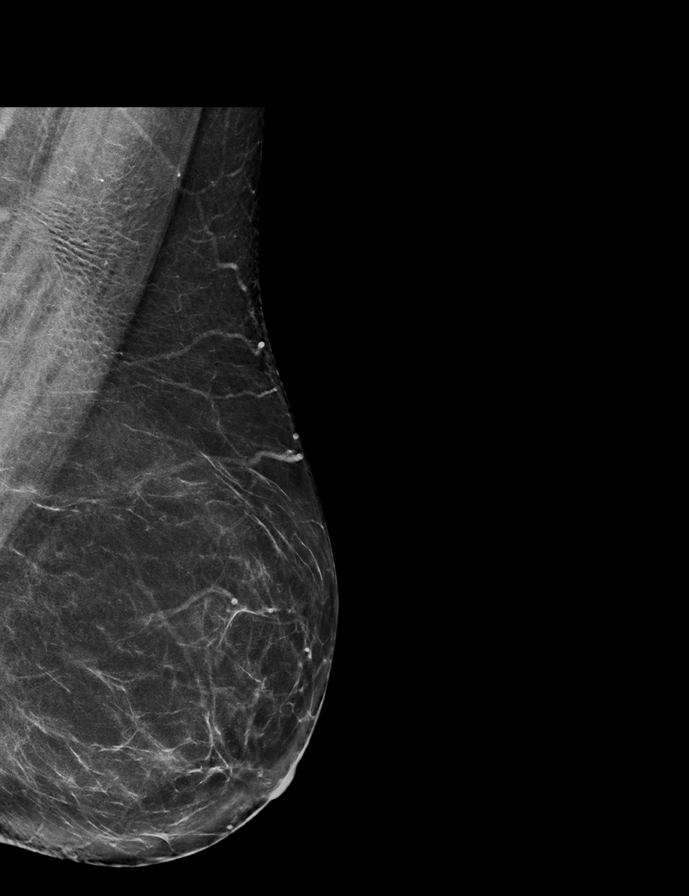

[R MLO synth-2D]
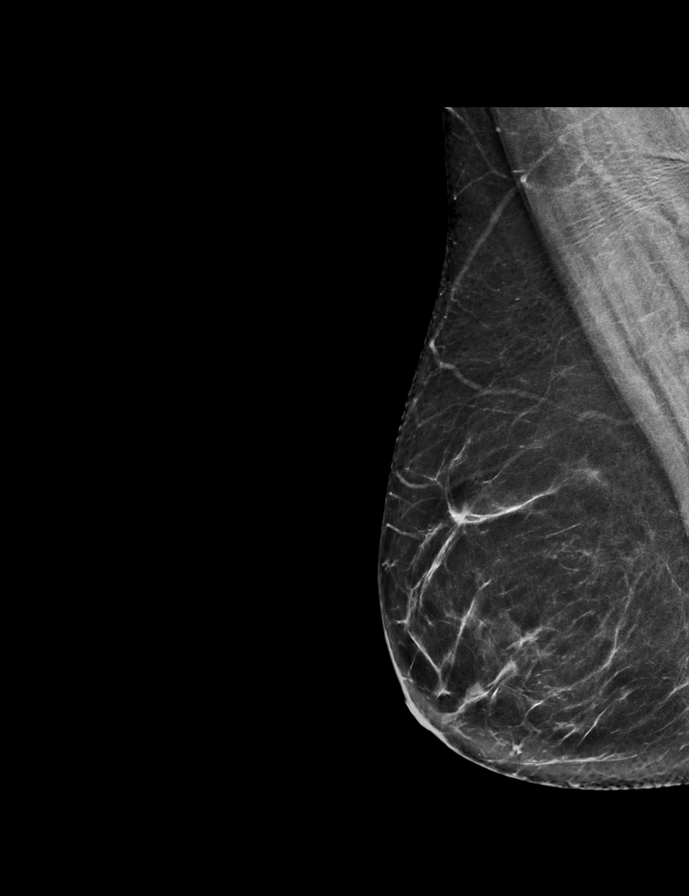

[R CC synth-2D]
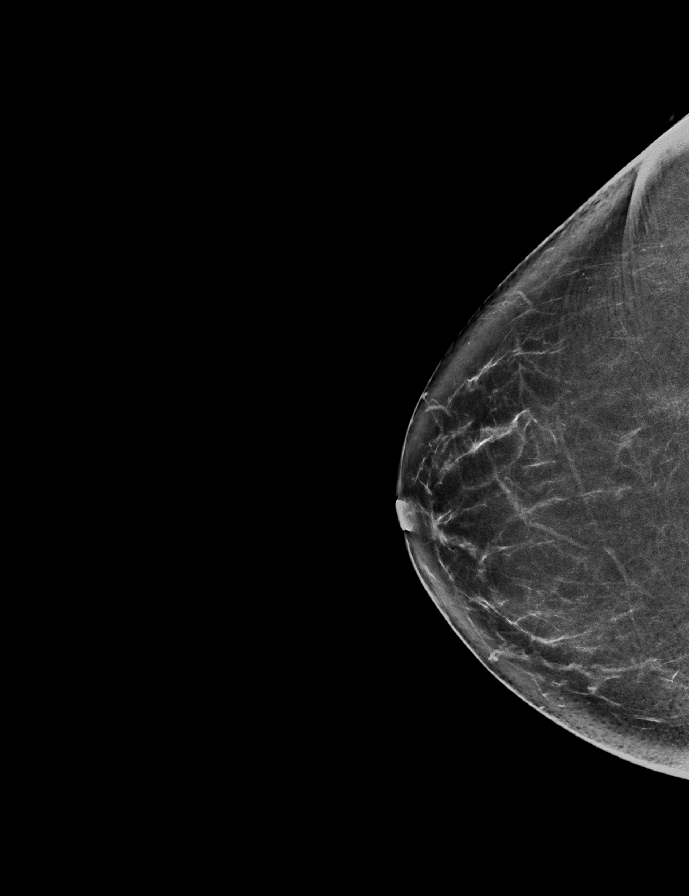

[R MLO tomo · 2 of 70 frames shown]
[frame 23/70]
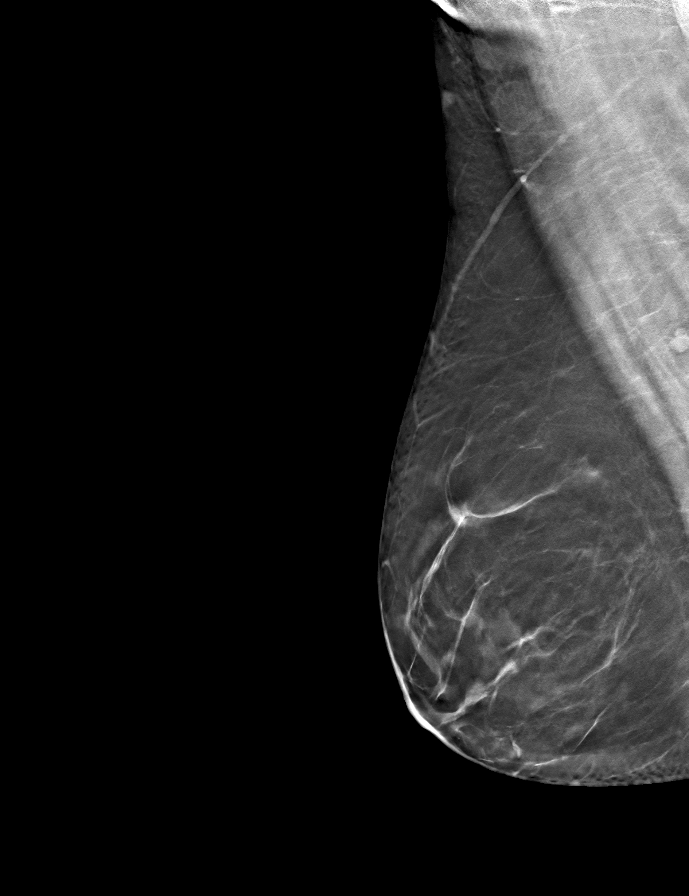
[frame 35/70]
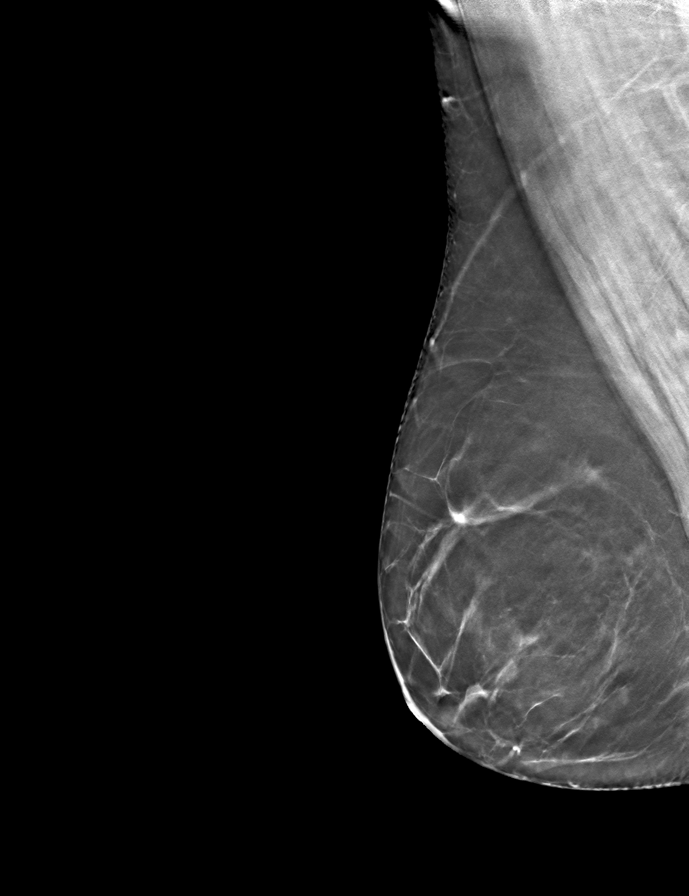

[R CC tomo · tomo slice 35/70.0]
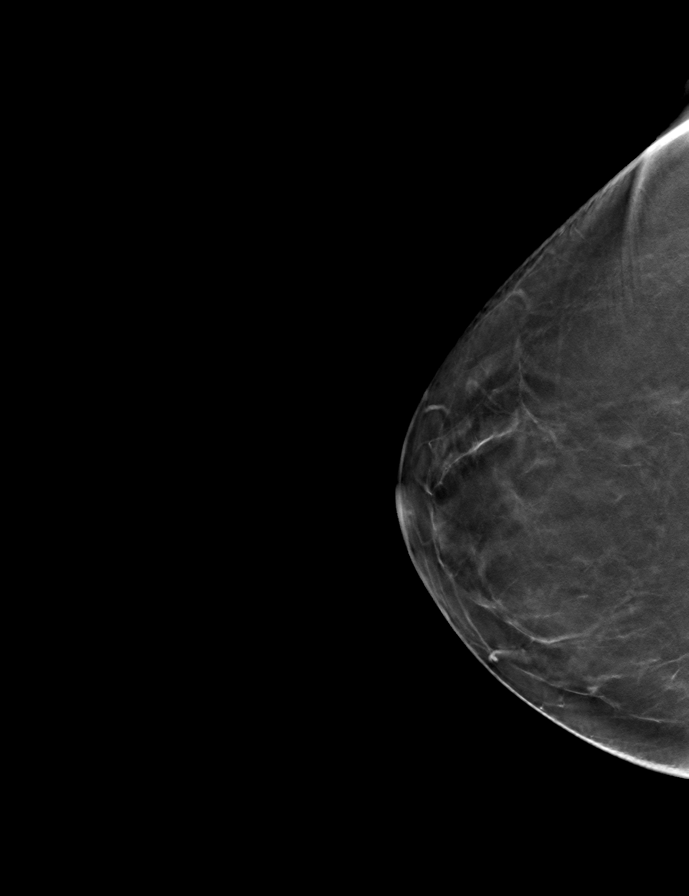

[L MLO tomo · tomo slice 36/71.0]
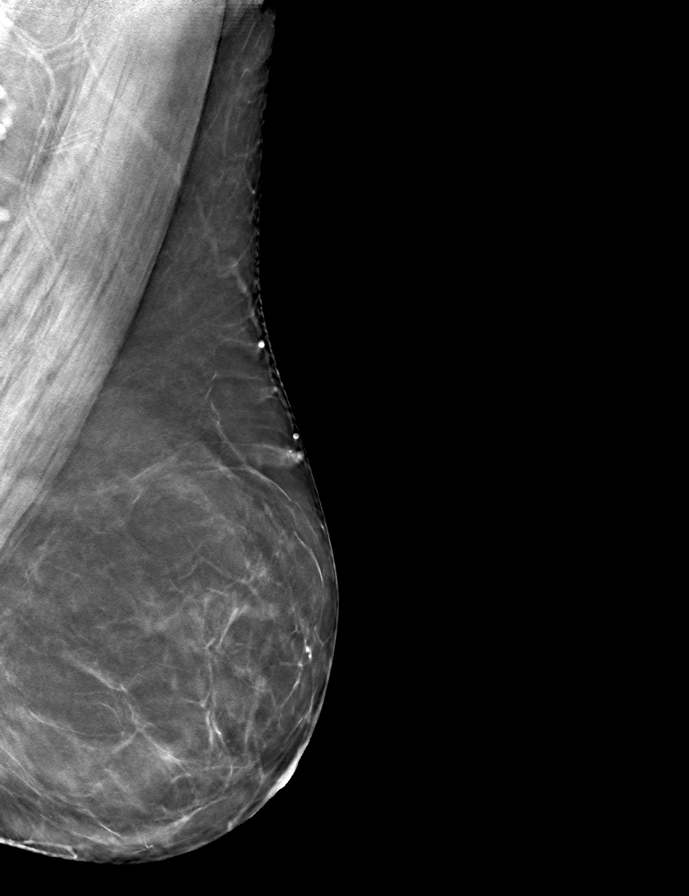

[L CC tomo · tomo slice 36/71.0]
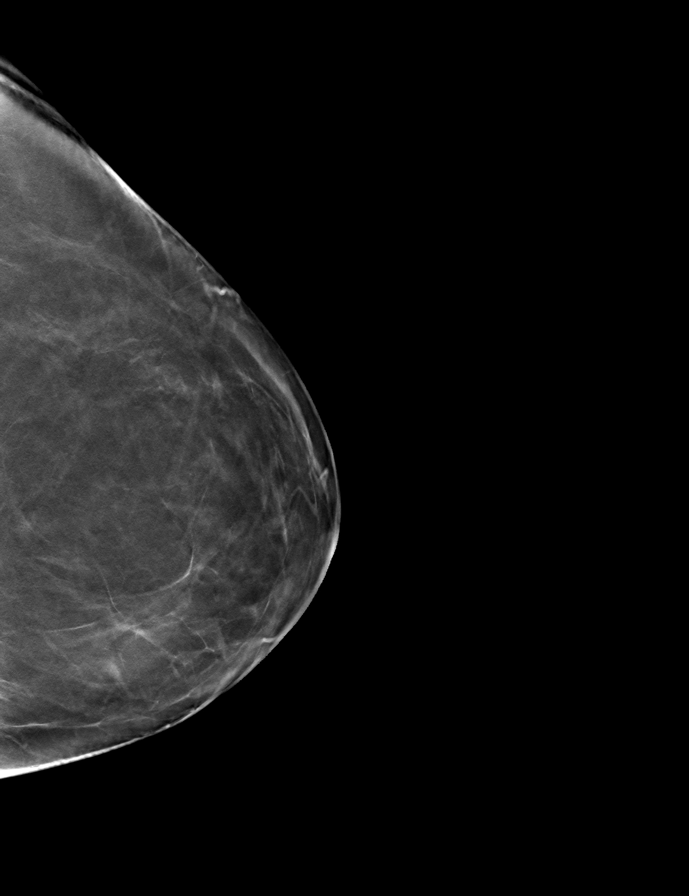

[9 of 24 positions shown; findings below may reference images not displayed]

ACR Breast Density Category b: There are scattered areas of
fibroglandular density.
FINDINGS: There are no findings suspicious for malignancy.
IMPRESSION: No mammographic evidence of malignancy. A result letter of this
screening mammogram will be mailed directly to the patient.

RECOMMENDATION:
Screening mammogram in one year. (Code:51-O-LD2)

BI-RADS CATEGORY  1: Negative.

## 2024-06-17 ENCOUNTER — Encounter (HOSPITAL_COMMUNITY): Payer: Self-pay | Admitting: Internal Medicine

## 2024-06-17 ENCOUNTER — Ambulatory Visit (HOSPITAL_COMMUNITY)
Admission: RE | Admit: 2024-06-17 | Discharge: 2024-06-17 | Disposition: A | Discharge status: Home / Self Care | Attending: Internal Medicine | Admitting: Internal Medicine

## 2024-06-17 ENCOUNTER — Ambulatory Visit (HOSPITAL_COMMUNITY): Payer: Self-pay | Admitting: Anesthesiology

## 2024-06-17 ENCOUNTER — Other Ambulatory Visit: Payer: Self-pay

## 2024-06-17 ENCOUNTER — Encounter (HOSPITAL_COMMUNITY)
Admission: RE | Disposition: A | Discharge status: Home / Self Care | Payer: Self-pay | Source: Home / Self Care | Attending: Internal Medicine

## 2024-06-17 DIAGNOSIS — R195 Other fecal abnormalities: Secondary | ICD-10-CM | POA: Insufficient documentation

## 2024-06-17 DIAGNOSIS — I10 Essential (primary) hypertension: Secondary | ICD-10-CM | POA: Diagnosis not present

## 2024-06-17 DIAGNOSIS — K635 Polyp of colon: Secondary | ICD-10-CM | POA: Diagnosis not present

## 2024-06-17 DIAGNOSIS — Z1211 Encounter for screening for malignant neoplasm of colon: Secondary | ICD-10-CM | POA: Insufficient documentation

## 2024-06-17 DIAGNOSIS — C180 Malignant neoplasm of cecum: Secondary | ICD-10-CM | POA: Diagnosis not present

## 2024-06-17 DIAGNOSIS — D122 Benign neoplasm of ascending colon: Secondary | ICD-10-CM | POA: Insufficient documentation

## 2024-06-17 HISTORY — PX: COLONOSCOPY: SHX5424

## 2024-06-17 SURGERY — COLONOSCOPY
Anesthesia: General

## 2024-06-17 MED ORDER — EPHEDRINE SULFATE-NACL 50-0.9 MG/10ML-% IV SOSY
PREFILLED_SYRINGE | INTRAVENOUS | Status: DC | PRN
Start: 2024-06-17 — End: 2024-06-17
  Administered 2024-06-17 (×2): 10 mg via INTRAVENOUS

## 2024-06-17 MED ORDER — LACTATED RINGERS IV SOLN: INTRAVENOUS | Status: DC | PRN

## 2024-06-17 MED ORDER — PHENYLEPHRINE 80 MCG/ML (10ML) SYRINGE FOR IV PUSH (FOR BLOOD PRESSURE SUPPORT)
PREFILLED_SYRINGE | INTRAVENOUS | Status: DC | PRN
  Administered 2024-06-17 (×2): 80 ug via INTRAVENOUS
  Administered 2024-06-17 (×2): 160 ug via INTRAVENOUS
  Administered 2024-06-17: 80 ug via INTRAVENOUS

## 2024-06-17 MED ORDER — LIDOCAINE 2% (20 MG/ML) 5 ML SYRINGE
INTRAMUSCULAR | Status: DC | PRN
  Administered 2024-06-17: 100 mg via INTRAVENOUS

## 2024-06-17 MED ORDER — PROPOFOL 10 MG/ML IV BOLUS
INTRAVENOUS | Status: DC | PRN
Start: 2024-06-17 — End: 2024-06-17
  Administered 2024-06-17: 100 mg via INTRAVENOUS

## 2024-06-17 MED ORDER — PROPOFOL 500 MG/50ML IV EMUL
INTRAVENOUS | Status: DC | PRN
  Administered 2024-06-17: 200 ug/kg/min via INTRAVENOUS

## 2024-06-17 MED ORDER — LACTATED RINGERS IV SOLN: INTRAVENOUS | Status: DC

## 2024-06-17 NOTE — Op Note (Addendum)
 [facility] Patient Name: Zoe Sherman Procedure Date: 06/17/2024 8:46 AM MRN: [id number] Date of Birth: July 11, 1961 Attending MD: Lamar Ozell Hollingshead , MD, [id number] CSN: [id number] Age: 63 Admit Type: Outpatient Procedure:                Colonoscopy Indications:              Positive Cologuard test Providers:                Lamar Ozell Hollingshead, MD, Crystal Page, Rosina Sprague, Jon Loge Referring MD:              Medicines:                Propofol  per Anesthesia Complications:            No immediate complications. Estimated Blood Loss:     Estimated blood loss was minimal. Procedure:                Pre-Anesthesia Assessment:                           - Prior to the procedure, a History and Physical                            was performed, and patient medications and                            allergies were reviewed. The patient's tolerance of                            previous anesthesia was also reviewed. The risks                            and benefits of the procedure and the sedation                            options and risks were discussed with the patient.                            All questions were answered, and informed consent                            was obtained. Prior Anticoagulants: The patient has                            taken no anticoagulant or antiplatelet agents. ASA                            Grade Assessment: II - A patient with mild systemic                            disease. After reviewing the risks and benefits,  the patient was deemed in satisfactory condition to                            undergo the procedure.                           After obtaining informed consent, the colonoscope                            was passed under direct vision. Throughout the                            procedure, the patient's blood pressure, pulse, and                            oxygen  saturations were monitored continuously. The                            [id number]) scope was introduced through the                            anus and advanced to the the cecum, identified by                            appendiceal orifice and ileocecal valve. The                            colonoscopy was performed without difficulty. The                            patient tolerated the procedure well. The quality                            of the bowel preparation was adequate. The patient                            tolerated the procedure well. Scope In: 9:06:40 AM Scope Out: 9:38:53 AM Scope Withdrawal Time: 0 hours 23 minutes 36 seconds  Total Procedure Duration: 0 hours 32 minutes 13 seconds  Findings:      The perianal and digital rectal examinations were normal.      Two semi-pedunculated polyps were found in the mid ascending colon. The       polyps were 5 to 6 mm in size. These polyps were removed with a cold       snare. Resection and retrieval were complete. Estimated blood loss was       minimal.      3 x 2 cm lesion in the base of the cecum surrounding the appendiceal       orifice. Please see photos. The center of this lesion would not lift       with a net of 10 cc of Ella view injected. It appeared to be tacked       down. The peripheral aspect of this lesion did lift the center was fixed       indicating suspicion for invasion (NIC 3 lesion). Biopsies  taken. No       attempt at removal made. Impression:               - Two 5 to 6 mm polyps in the mid ascending colon,                            removed with a cold snare. Resected and retrieved.                           - Cecal lesion as described. Centrally this was a                            fixed tacked down lesion suspicious for invasion.                            Biopsied                           - Cecal lesion will require surgical excision to                            ensure entire removal. Moderate  Sedation:      Moderate (conscious) sedation was personally administered by an       anesthesia professional. The following parameters were monitored: oxygen       saturation, heart rate, blood pressure, respiratory rate, EKG, adequacy       of pulmonary ventilation, and response to care. Recommendation:           - Patient has a contact number available for                            emergencies. The signs and symptoms of potential                            delayed complications were discussed with the                            patient. Return to normal activities tomorrow.                            Written discharge instructions were provided to the                            patient.                           - Advance diet as tolerated. Follow-up on                            pathology. Anticipate surgical consultation in the                            near future. Procedure Code(s):        --- Professional ---  54614, Colonoscopy, flexible; with removal of                            tumor(s), polyp(s), or other lesion(s) by snare                            technique Diagnosis Code(s):        --- Professional ---                           D12.2, Benign neoplasm of ascending colon                           R19.5, Other fecal abnormalities CPT copyright 2022 [employer]. All rights reserved. The codes documented in this report are preliminary and upon coder review may  be revised to meet current compliance requirements. Lamar HERO. Baraka Klatt, MD Lamar Ozell Hollingshead, MD 06/17/2024 9:50:34 AM This report has been signed electronically. Number of Addenda: 0

## 2024-06-17 NOTE — Interval H&P Note (Signed)
 History and Physical Interval Note:  06/17/2024 8:58 AM  Zoe Sherman  has presented today for surgery, with the diagnosis of positive cologuard,rectal bleeding.  The various methods of treatment have been discussed with the patient and family. After consideration of risks, benefits and other options for treatment, the patient has consented to  Procedure(s) with comments: COLONOSCOPY (N/A) - 1:30 pm, asa 2 as a surgical intervention.  The patient's history has been reviewed, patient examined, no change in status, stable for surgery.  I have reviewed the patient's chart and labs.  Questions were answered to the patient's satisfaction.     Dakai Braithwaite  No change.  Positive Cologuard.  Diagnostic colonoscopy today per plan.  The risks, benefits, limitations, alternatives and imponderables have been reviewed with the patient. Questions have been answered. All parties are agreeable.

## 2024-06-17 NOTE — Anesthesia Preprocedure Evaluation (Signed)

## 2024-06-17 NOTE — Discharge Instructions (Addendum)
  Colonoscopy Discharge Instructions  Read the instructions outlined below and refer to this sheet in the next few weeks. These discharge instructions provide you with general information on caring for yourself after you leave the hospital. Your doctor may also give you specific instructions. While your treatment has been planned according to the most current medical practices available, unavoidable complications occasionally occur. If you have any problems or questions after discharge, call Dr. Shaaron at [phone]. ACTIVITY You may resume your regular activity, but move at a slower pace for the next 24 hours.  Take frequent rest periods for the next 24 hours.  Walking will help get rid of the air and reduce the bloated feeling in your belly (abdomen).  No driving for 24 hours (because of the medicine (anesthesia) used during the test).   Do not sign any important legal documents or operate any machinery for 24 hours (because of the anesthesia used during the test).  NUTRITION Drink plenty of fluids.  You may resume your normal diet as instructed by your doctor.  Begin with a light meal and progress to your normal diet. Heavy or fried foods are harder to digest and may make you feel sick to your stomach (nauseated).  Avoid alcoholic beverages for 24 hours or as instructed.  MEDICATIONS You may resume your normal medications unless your doctor tells you otherwise.  WHAT YOU CAN EXPECT TODAY Some feelings of bloating in the abdomen.  Passage of more gas than usual.  Spotting of blood in your stool or on the toilet paper.  IF YOU HAD POLYPS REMOVED DURING THE COLONOSCOPY: No aspirin products for 7 days or as instructed.  No alcohol for 7 days or as instructed.  Eat a soft diet for the next 24 hours.  FINDING OUT THE RESULTS OF YOUR TEST Not all test results are available during your visit. If your test results are not back during the visit, make an appointment with your caregiver to find out the  results. Do not assume everything is normal if you have not heard from your caregiver or the medical facility. It is important for you to follow up on all of your test results.  SEEK IMMEDIATE MEDICAL ATTENTION IF: You have more than a spotting of blood in your stool.  Your belly is swollen (abdominal distention).  You are nauseated or vomiting.  You have a temperature over 101.  You have abdominal pain or discomfort that is severe or gets worse throughout the day.    You have a relatively large polypoid mass in your cecum (upstream end of your colon).  It cannot be removed through the scope.  It was biopsied.  This lesion will need to be removed by operation  2 small polyps were removed from your colon  Further recommendations to follow pending review of pathology report

## 2024-06-17 NOTE — Interval H&P Note (Signed)
 History and Physical Interval Note:  06/17/2024 8:51 AM  Arland DELENA Sar  has presented today for surgery, with the diagnosis of positive cologuard,rectal bleeding.  The various methods of treatment have been discussed with the patient and family. After consideration of risks, benefits and other options for treatment, the patient has consented to  Procedure(s) with comments: COLONOSCOPY (N/A) - 1:30 pm, asa 2 as a surgical intervention.  The patient's history has been reviewed, patient examined, no change in status, stable for surgery.  I have reviewed the patient's chart and labs.  Questions were answered to the patient's satisfaction.     Bonnie Overdorf  No change.  Patient is here for diagnostic colonoscopy given positive Cologuard. The risks, benefits, limitations, alternatives and imponderables have been reviewed with the patient. Questions have been answered. All parties are agreeable.

## 2024-06-17 NOTE — Transfer of Care (Signed)
 Immediate Anesthesia Transfer of Care Note  Patient: Zoe Sherman  Procedure(s) Performed: COLONOSCOPY  Patient Location: Endoscopy Unit  Anesthesia Type:General  Level of Consciousness: awake, alert , oriented, and patient cooperative  Airway & Oxygen Therapy: Patient Spontanous Breathing  Post-op Assessment: Report given to RN, Post -op Vital signs reviewed and stable, and Patient moving all extremities X 4  Post vital signs: Reviewed and stable  Last Vitals:  Vitals Value Taken Time  BP 123/53 06/17/24 09:43  Temp 36.6 C 06/17/24 09:43  Pulse 95 06/17/24 09:43  Resp 15 06/17/24 09:43  SpO2 100 % 06/17/24 09:43    Last Pain:  Vitals:   06/17/24 0943  TempSrc: Oral  PainSc: 0-No pain      Patients Stated Pain Goal: 8 (06/17/24 0818)  Complications: No notable events documented.

## 2024-06-18 ENCOUNTER — Encounter (HOSPITAL_COMMUNITY): Payer: Self-pay | Admitting: Internal Medicine

## 2024-06-18 ENCOUNTER — Ambulatory Visit: Payer: Self-pay | Admitting: Internal Medicine

## 2024-06-18 DIAGNOSIS — C801 Malignant (primary) neoplasm, unspecified: Secondary | ICD-10-CM

## 2024-06-18 NOTE — Anesthesia Postprocedure Evaluation (Signed)
 Anesthesia Post Note  Patient: Zoe Sherman  Procedure(s) Performed: COLONOSCOPY  Patient location during evaluation: Phase II Anesthesia Type: General Level of consciousness: awake Pain management: pain level controlled Vital Signs Assessment: post-procedure vital signs reviewed and stable Respiratory status: spontaneous breathing and respiratory function stable Cardiovascular status: blood pressure returned to baseline and stable Postop Assessment: no headache and no apparent nausea or vomiting Anesthetic complications: no Comments: Late entry   No notable events documented.   Last Vitals:  Vitals:   06/17/24 0820 06/17/24 0943  BP: (!) (P) 145/67 (!) 123/53  Pulse:  95  Resp:  15  Temp:  36.6 C  SpO2:  100%    Last Pain:  Vitals:   06/17/24 0943  TempSrc: Oral  PainSc: 0-No pain                 Zoe Sherman

## 2024-06-21 NOTE — Addendum Note (Signed)
 Addended by: JEANELL GRAEME RAMAN on: 06/21/2024 08:23 AM   Modules accepted: Orders

## 2024-06-22 LAB — SURGICAL PATHOLOGY

## 2024-06-24 ENCOUNTER — Ambulatory Visit: Payer: Self-pay | Admitting: General Surgery

## 2024-06-24 DIAGNOSIS — C182 Malignant neoplasm of ascending colon: Secondary | ICD-10-CM | POA: Diagnosis not present

## 2024-06-28 ENCOUNTER — Other Ambulatory Visit: Payer: Self-pay | Admitting: General Surgery

## 2024-06-28 DIAGNOSIS — C182 Malignant neoplasm of ascending colon: Secondary | ICD-10-CM

## 2024-07-05 ENCOUNTER — Ambulatory Visit (HOSPITAL_COMMUNITY)
Admission: RE | Admit: 2024-07-05 | Discharge: 2024-07-05 | Disposition: A | Discharge status: Home / Self Care | Source: Ambulatory Visit | Attending: Obstetrics and Gynecology | Admitting: Obstetrics and Gynecology

## 2024-07-05 DIAGNOSIS — Z1231 Encounter for screening mammogram for malignant neoplasm of breast: Secondary | ICD-10-CM | POA: Diagnosis not present

## 2024-07-07 ENCOUNTER — Encounter: Payer: Self-pay | Admitting: Radiology

## 2024-07-07 ENCOUNTER — Ambulatory Visit: Payer: Self-pay | Admitting: Obstetrics and Gynecology

## 2024-07-07 ENCOUNTER — Ambulatory Visit
Admission: RE | Admit: 2024-07-07 | Discharge: 2024-07-07 | Disposition: A | Discharge status: Home / Self Care | Source: Ambulatory Visit | Attending: General Surgery | Admitting: General Surgery

## 2024-07-07 DIAGNOSIS — C182 Malignant neoplasm of ascending colon: Secondary | ICD-10-CM | POA: Diagnosis not present

## 2024-07-07 MED ORDER — CHLORHEXIDINE GLUCONATE 4 % EX SOLN: 1.0000 | Freq: Once | CUTANEOUS | Status: DC

## 2024-07-07 MED ORDER — IOPAMIDOL (ISOVUE-300) INJECTION 61%
100.0000 mL | Freq: Once | INTRAVENOUS | Status: AC | PRN
  Administered 2024-07-07: 100 mL via INTRAVENOUS

## 2024-07-08 NOTE — Patient Instructions (Signed)
 SURGICAL WAITING ROOM VISITATION  Patients having surgery or a procedure may have no more than 2 support people in the waiting area - these visitors may rotate.    Children under the age of 62 must have an adult with them who is not the patient.  Visitors with respiratory illnesses are discouraged from visiting and should remain at home.  If the patient needs to stay at the hospital during part of their recovery, the visitor guidelines for inpatient rooms apply. Pre-op nurse will coordinate an appropriate time for 1 support person to accompany patient in pre-op.  This support person may not rotate.    Please refer to the [facility] website for the visitor guidelines for Inpatients (after your surgery is over and you are in a regular room).       Your procedure is scheduled on: 07/19/24   Report to [facility] Main Entrance    Report to admitting at : 7:15 AM   Call this number if you have problems the morning of surgery [phone]     Clear liquids starting the day before surgery until : 6:30 AM DAY OF SURGERY. Drink plenty clear liquids the day before surgery.  Water Non-Citrus Juices (without pulp, NO RED-Apple, White grape, White cranberry) Black Coffee (NO MILK/CREAM OR CREAMERS, sugar ok)  Clear Tea (NO MILK/CREAM OR CREAMERS, sugar ok) regular and decaf                             Plain Jell-O (NO RED)                                           Fruit ices (not with fruit pulp, NO RED)                                     Popsicles (NO RED)                                                               Sports drinks like Gatorade (NO RED)              Drink 2 Ensure drinks AT 10:00 PM the night before surgery.        The day of surgery:  Drink ONE (1) Pre-Surgery Clear Ensure at : 6:30 AM the morning of surgery. Drink in one sitting. Do not sip.  This drink was given to you during your hospital  pre-op appointment visit. Nothing else to drink after completing  the  Pre-Surgery Clear Ensure or G2.          If you have questions, please contact your surgeon's office.  FOLLOW BOWEL PREP AND ANY ADDITIONAL PRE OP INSTRUCTIONS YOU RECEIVED FROM YOUR SURGEON'S OFFICE!!!   Oral Hygiene is also important to reduce your risk of infection.                                    Remember - BRUSH YOUR TEETH THE MORNING  OF SURGERY WITH YOUR REGULAR TOOTHPASTE  DENTURES WILL BE REMOVED PRIOR TO SURGERY PLEASE DO NOT APPLY Poly grip OR ADHESIVES!!!   Do NOT smoke after Midnight   Stop all vitamins and herbal supplements 7 days before surgery.   Take these medicines the morning of surgery with A SIP OF WATER: NONE.                              You may not have any metal on your body including hair pins, jewelry, and body piercing             Do not wear make-up, lotions, powders, perfumes/cologne, or deodorant  Do not wear nail polish including gel and S&S, artificial/acrylic nails, or any other type of covering on natural nails including finger and toenails. If you have artificial nails, gel coating, etc. that needs to be removed by a nail salon please have this removed prior to surgery or surgery may need to be canceled/ delayed if the surgeon/ anesthesia feels like they are unable to be safely monitored.   Do not shave  48 hours prior to surgery.    Do not bring valuables to the hospital. [place] IS NOT             RESPONSIBLE   FOR VALUABLES.   Contacts, glasses, dentures or bridgework may not be worn into surgery.   Bring small overnight bag day of surgery.   DO NOT BRING YOUR HOME MEDICATIONS TO THE HOSPITAL. PHARMACY WILL DISPENSE MEDICATIONS LISTED ON YOUR MEDICATION LIST TO YOU DURING YOUR ADMISSION IN THE HOSPITAL!    Patients discharged on the day of surgery will not be allowed to drive home.  Someone NEEDS to stay with you for the first 24 hours after anesthesia.   Special Instructions: Bring a copy of your healthcare power of attorney  and living will documents the day of surgery if you haven't scanned them before.              Please read over the following fact sheets you were given: IF YOU HAVE QUESTIONS ABOUT YOUR PRE-OP INSTRUCTIONS PLEASE CALL [phone]   If you received a COVID test during your pre-op visit  it is requested that you wear a mask when out in public, stay away from anyone that may not be feeling well and notify your surgeon if you develop symptoms. If you test positive for Covid or have been in contact with anyone that has tested positive in the last 10 days please notify you surgeon.    [facility] - Preparing for Surgery Before surgery, you can play an important role.  Because skin is not sterile, your skin needs to be as free of germs as possible.  You can reduce the number of germs on your skin by washing with CHG (chlorahexidine gluconate) soap before surgery.  CHG is an antiseptic cleaner which kills germs and bonds with the skin to continue killing germs even after washing. Please DO NOT use if you have an allergy to CHG or antibacterial soaps.  If your skin becomes reddened/irritated stop using the CHG and inform your nurse when you arrive at Short Stay. Do not shave (including legs and underarms) for at least 48 hours prior to the first CHG shower.  You may shave your face/neck. Please follow these instructions carefully:  1.  Shower with CHG Soap the night before surgery and the  morning of  Surgery.  2.  If you choose to wash your hair, wash your hair first as usual with your  normal  shampoo.  3.  After you shampoo, rinse your hair and body thoroughly to remove the  shampoo.                           4.  Use CHG as you would any other liquid soap.  You can apply chg directly  to the skin and wash                       Gently with a scrungie or clean washcloth.  5.  Apply the CHG Soap to your body ONLY FROM THE NECK DOWN.   Do not use on face/ open                           Wound or open sores.  Avoid contact with eyes, ears mouth and genitals (private parts).                       Wash face,  Genitals (private parts) with your normal soap.             6.  Wash thoroughly, paying special attention to the area where your surgery  will be performed.  7.  Thoroughly rinse your body with warm water from the neck down.  8.  DO NOT shower/wash with your normal soap after using and rinsing off  the CHG Soap.                9.  Pat yourself dry with a clean towel.            10.  Wear clean pajamas.            11.  Place clean sheets on your bed the night of your first shower and do not  sleep with pets. Day of Surgery : Do not apply any lotions/deodorants the morning of surgery.  Please wear clean clothes to the hospital/surgery center.  FAILURE TO FOLLOW THESE INSTRUCTIONS MAY RESULT IN THE CANCELLATION OF YOUR SURGERY PATIENT SIGNATURE_________________________________  NURSE SIGNATURE__________________________________  ________________________________________________________________________

## 2024-07-09 ENCOUNTER — Encounter (HOSPITAL_COMMUNITY)
Admission: RE | Admit: 2024-07-09 | Discharge: 2024-07-09 | Disposition: A | Discharge status: Home / Self Care | Source: Ambulatory Visit | Attending: General Surgery | Admitting: General Surgery

## 2024-07-09 ENCOUNTER — Other Ambulatory Visit: Payer: Self-pay

## 2024-07-09 ENCOUNTER — Encounter (HOSPITAL_COMMUNITY): Payer: Self-pay

## 2024-07-09 VITALS — BP 151/77 | HR 70 | Temp 98.6°F | Ht 65.0 in | Wt 142.0 lb

## 2024-07-09 DIAGNOSIS — Z01818 Encounter for other preprocedural examination: Secondary | ICD-10-CM | POA: Insufficient documentation

## 2024-07-09 DIAGNOSIS — I10 Essential (primary) hypertension: Secondary | ICD-10-CM | POA: Insufficient documentation

## 2024-07-09 DIAGNOSIS — C182 Malignant neoplasm of ascending colon: Secondary | ICD-10-CM | POA: Insufficient documentation

## 2024-07-09 HISTORY — DX: Malignant (primary) neoplasm, unspecified: C80.1

## 2024-07-09 LAB — COMPREHENSIVE METABOLIC PANEL WITH GFR
ALT: 26 U/L (ref 0–44)
AST: 25 U/L (ref 15–41)
Albumin: 4.3 g/dL (ref 3.5–5.0)
Alkaline Phosphatase: 95 U/L (ref 38–126)
Anion gap: 9 (ref 5–15)
BUN: 17 mg/dL (ref 6–20)
CO2: 26 mmol/L (ref 22–32)
Calcium: 9.3 mg/dL (ref 8.9–10.3)
Chloride: 102 mmol/L (ref 98–111)
Creatinine, Ser: 1.06 mg/dL — ABNORMAL HIGH (ref 0.44–1.00)
GFR, Estimated: >60 mL/min (ref >60)
Glucose, Bld: 94 mg/dL (ref 70–99)
Potassium: 4 mmol/L (ref 3.5–5.1)
Sodium: 137 mmol/L (ref 135–145)
Total Bilirubin: 0.8 mg/dL (ref 0.0–1.2)
Total Protein: 7.1 g/dL (ref 6.5–8.1)

## 2024-07-09 LAB — CBC
HCT: 37.6 % (ref 36.0–46.0)
Hemoglobin: 12.7 g/dL (ref 12.0–15.0)
MCH: 30.8 pg (ref 26.0–34.0)
MCHC: 33.8 g/dL (ref 30.0–36.0)
MCV: 91 fL (ref 80.0–100.0)
Platelets: 214 K/uL (ref 150–400)
RBC: 4.13 MIL/uL (ref 3.87–5.11)
RDW: 12.7 % (ref 11.5–15.5)
WBC: 5.8 K/uL (ref 4.0–10.5)
nRBC: 0 % (ref 0.0–0.2)

## 2024-07-09 LAB — HEMOGLOBIN A1C
Hgb A1c MFr Bld: 5.3 % (ref 4.8–5.6)
Mean Plasma Glucose: 105.41 mg/dL

## 2024-07-09 NOTE — Progress Notes (Signed)
 For Anesthesia: PCP - Shona Norleen PEDLAR, MD  Cardiologist - N/A  Bowel Prep reminder:  Chest x-ray -  EKG - 07/09/24 Stress Test -  ECHO -  Cardiac Cath -  Pacemaker/ICD device last checked: Pacemaker orders received: Device Rep notified:  Spinal Cord Stimulator:N/A  Sleep Study - N/A CPAP -   Fasting Blood Sugar - N/A Checks Blood Sugar _____ times a day Date and result of last Hgb A1c-  Last dose of GLP1 agonist- N/A GLP1 instructions:   Last dose of SGLT-2 inhibitors- N/A SGLT-2 instructions:   Blood Thinner Instructions:N/A Aspirin Instructions: Last Dose:  Activity level: Can go up a flight of stairs and activities of daily living without stopping and without chest pain and/or shortness of breath   Able to exercise without chest pain and/or shortness of breath  Anesthesia review: Hx: HTN  Patient denies shortness of breath, fever, cough and chest pain at PAT appointment   Patient verbalized understanding of instructions that were reviewed over the telephone.

## 2024-07-19 ENCOUNTER — Other Ambulatory Visit: Payer: Self-pay

## 2024-07-19 ENCOUNTER — Encounter (HOSPITAL_COMMUNITY)
Admission: RE | Disposition: A | Discharge status: Home / Self Care | Payer: Self-pay | Source: Ambulatory Visit | Attending: General Surgery

## 2024-07-19 ENCOUNTER — Encounter (HOSPITAL_COMMUNITY): Payer: Self-pay | Admitting: General Surgery

## 2024-07-19 ENCOUNTER — Inpatient Hospital Stay (HOSPITAL_COMMUNITY): Payer: Self-pay | Admitting: Medical

## 2024-07-19 ENCOUNTER — Inpatient Hospital Stay (HOSPITAL_COMMUNITY)
Admission: RE | Admit: 2024-07-19 | Discharge: 2024-07-21 | DRG: 330 | Disposition: A | Discharge status: Home / Self Care | Source: Ambulatory Visit | Attending: General Surgery | Admitting: General Surgery

## 2024-07-19 ENCOUNTER — Inpatient Hospital Stay (HOSPITAL_COMMUNITY): Payer: Self-pay

## 2024-07-19 DIAGNOSIS — Z79899 Other long term (current) drug therapy: Secondary | ICD-10-CM | POA: Diagnosis not present

## 2024-07-19 DIAGNOSIS — C182 Malignant neoplasm of ascending colon: Secondary | ICD-10-CM | POA: Diagnosis present

## 2024-07-19 DIAGNOSIS — C180 Malignant neoplasm of cecum: Principal | ICD-10-CM | POA: Diagnosis present

## 2024-07-19 DIAGNOSIS — C189 Malignant neoplasm of colon, unspecified: Secondary | ICD-10-CM

## 2024-07-19 DIAGNOSIS — K388 Other specified diseases of appendix: Secondary | ICD-10-CM | POA: Diagnosis not present

## 2024-07-19 DIAGNOSIS — C181 Malignant neoplasm of appendix: Secondary | ICD-10-CM | POA: Diagnosis not present

## 2024-07-19 HISTORY — PX: LAPAROSCOPIC PARTIAL COLECTOMY: SHX5907

## 2024-07-19 LAB — CBC
HCT: 36.6 % (ref 36.0–46.0)
Hemoglobin: 12.4 g/dL (ref 12.0–15.0)
MCH: 30.7 pg (ref 26.0–34.0)
MCHC: 33.9 g/dL (ref 30.0–36.0)
MCV: 90.6 fL (ref 80.0–100.0)
Platelets: 221 K/uL (ref 150–400)
RBC: 4.04 MIL/uL (ref 3.87–5.11)
RDW: 12.6 % (ref 11.5–15.5)
WBC: 12.8 K/uL — ABNORMAL HIGH (ref 4.0–10.5)
nRBC: 0 % (ref 0.0–0.2)

## 2024-07-19 LAB — TYPE AND SCREEN
ABO/RH(D): O POS
Antibody Screen: NEGATIVE

## 2024-07-19 LAB — CREATININE, SERUM
Creatinine, Ser: 1.39 mg/dL — ABNORMAL HIGH (ref 0.44–1.00)
GFR, Estimated: 43 mL/min — ABNORMAL LOW (ref >60)

## 2024-07-19 SURGERY — LAPAROSCOPIC PARTIAL COLECTOMY
Anesthesia: General

## 2024-07-19 MED ORDER — PROPOFOL 500 MG/50ML IV EMUL
INTRAVENOUS | Status: DC | PRN
Start: 2024-07-19 — End: 2024-07-19
  Administered 2024-07-19: 50 ug/kg/min via INTRAVENOUS

## 2024-07-19 MED ORDER — MIDAZOLAM HCL 2 MG/2ML IJ SOLN
INTRAMUSCULAR | Status: AC
Start: 2024-07-19 — End: 2024-07-19
  Filled 2024-07-19: qty 2

## 2024-07-19 MED ORDER — PROPOFOL 10 MG/ML IV BOLUS
INTRAVENOUS | Status: AC
  Filled 2024-07-19: qty 20

## 2024-07-19 MED ORDER — ONDANSETRON HCL 4 MG/2ML IJ SOLN
INTRAMUSCULAR | Status: DC | PRN
  Administered 2024-07-19: 4 mg via INTRAVENOUS

## 2024-07-19 MED ORDER — ONDANSETRON HCL 4 MG PO TABS: 4.0000 mg | ORAL_TABLET | Freq: Four times a day (QID) | ORAL | Status: DC | PRN

## 2024-07-19 MED ORDER — HYDROMORPHONE HCL 1 MG/ML IJ SOLN
INTRAMUSCULAR | Status: DC | PRN
  Administered 2024-07-19: 1 mg via INTRAVENOUS

## 2024-07-19 MED ORDER — PROPOFOL 10 MG/ML IV BOLUS
INTRAVENOUS | Status: DC | PRN
  Administered 2024-07-19: 130 mg via INTRAVENOUS

## 2024-07-19 MED ORDER — OXYCODONE HCL 5 MG/5ML PO SOLN: 5.0000 mg | Freq: Once | ORAL | Status: DC | PRN

## 2024-07-19 MED ORDER — OXYCODONE HCL 5 MG PO TABS
5.0000 mg | ORAL_TABLET | Freq: Four times a day (QID) | ORAL | Status: DC | PRN
  Administered 2024-07-19 – 2024-07-20 (×2): 5 mg via ORAL
  Filled 2024-07-19 (×2): qty 1

## 2024-07-19 MED ORDER — FENTANYL CITRATE (PF) 100 MCG/2ML IJ SOLN
INTRAMUSCULAR | Status: AC
  Filled 2024-07-19: qty 2

## 2024-07-19 MED ORDER — FENTANYL CITRATE (PF) 100 MCG/2ML IJ SOLN
INTRAMUSCULAR | Status: DC | PRN
  Administered 2024-07-19: 100 ug via INTRAVENOUS

## 2024-07-19 MED ORDER — LACTATED RINGERS IV SOLN: INTRAVENOUS | Status: DC

## 2024-07-19 MED ORDER — BUPIVACAINE-EPINEPHRINE (PF) 0.25% -1:200000 IJ SOLN
INTRAMUSCULAR | Status: AC
  Filled 2024-07-19: qty 30

## 2024-07-19 MED ORDER — PHENYLEPHRINE 80 MCG/ML (10ML) SYRINGE FOR IV PUSH (FOR BLOOD PRESSURE SUPPORT)
PREFILLED_SYRINGE | INTRAVENOUS | Status: AC
  Filled 2024-07-19: qty 10

## 2024-07-19 MED ORDER — ORAL CARE MOUTH RINSE: 15.0000 mL | Freq: Once | OROMUCOSAL | Status: AC

## 2024-07-19 MED ORDER — SUGAMMADEX SODIUM 200 MG/2ML IV SOLN
INTRAVENOUS | Status: DC | PRN
Start: 2024-07-19 — End: 2024-07-19
  Administered 2024-07-19: 150 mg via INTRAVENOUS

## 2024-07-19 MED ORDER — ENSURE SURGERY PO LIQD
237.0000 mL | Freq: Two times a day (BID) | ORAL | Status: DC
  Administered 2024-07-20: 237 mL via ORAL

## 2024-07-19 MED ORDER — ALVIMOPAN 12 MG PO CAPS
12.0000 mg | ORAL_CAPSULE | Freq: Two times a day (BID) | ORAL | Status: DC
  Administered 2024-07-20 (×2): 12 mg via ORAL
  Filled 2024-07-19 (×2): qty 1

## 2024-07-19 MED ORDER — ACETAMINOPHEN 500 MG PO TABS
1000.0000 mg | ORAL_TABLET | ORAL | Status: AC
  Administered 2024-07-19: 1000 mg via ORAL
  Filled 2024-07-19: qty 2

## 2024-07-19 MED ORDER — CELECOXIB 200 MG PO CAPS
200.0000 mg | ORAL_CAPSULE | Freq: Two times a day (BID) | ORAL | Status: DC
  Administered 2024-07-19 – 2024-07-21 (×5): 200 mg via ORAL
  Filled 2024-07-19 (×5): qty 1

## 2024-07-19 MED ORDER — AMLODIPINE BESYLATE 5 MG PO TABS
5.0000 mg | ORAL_TABLET | Freq: Every day | ORAL | Status: DC
Start: 2024-07-20 — End: 2024-07-21
  Administered 2024-07-20: 5 mg via ORAL
  Filled 2024-07-19 (×2): qty 1

## 2024-07-19 MED ORDER — HEPARIN SODIUM (PORCINE) 5000 UNIT/ML IJ SOLN
5000.0000 [IU] | Freq: Once | INTRAMUSCULAR | Status: AC
  Administered 2024-07-19: 5000 [IU] via SUBCUTANEOUS
  Filled 2024-07-19: qty 1

## 2024-07-19 MED ORDER — BUPIVACAINE LIPOSOME 1.3 % IJ SUSP: 20.0000 mL | Freq: Once | INTRAMUSCULAR | Status: DC

## 2024-07-19 MED ORDER — ROCURONIUM BROMIDE 10 MG/ML (PF) SYRINGE
PREFILLED_SYRINGE | INTRAVENOUS | Status: AC
  Filled 2024-07-19: qty 10

## 2024-07-19 MED ORDER — ENSURE PRE-SURGERY PO LIQD: 592.0000 mL | Freq: Once | ORAL | Status: DC

## 2024-07-19 MED ORDER — LIDOCAINE HCL (PF) 2 % IJ SOLN
INTRAMUSCULAR | Status: AC
  Filled 2024-07-19: qty 5

## 2024-07-19 MED ORDER — OXYCODONE HCL 5 MG PO TABS: 5.0000 mg | ORAL_TABLET | Freq: Once | ORAL | Status: DC | PRN

## 2024-07-19 MED ORDER — SUGAMMADEX SODIUM 200 MG/2ML IV SOLN
INTRAVENOUS | Status: AC
  Filled 2024-07-19: qty 2

## 2024-07-19 MED ORDER — MIDAZOLAM HCL 5 MG/5ML IJ SOLN
INTRAMUSCULAR | Status: DC | PRN
  Administered 2024-07-19: 2 mg via INTRAVENOUS

## 2024-07-19 MED ORDER — LACTATED RINGERS IR SOLN
Status: DC | PRN
  Administered 2024-07-19: 1000 mL

## 2024-07-19 MED ORDER — HYDROMORPHONE HCL 1 MG/ML IJ SOLN: 0.5000 mg | INTRAMUSCULAR | Status: DC | PRN

## 2024-07-19 MED ORDER — ALVIMOPAN 12 MG PO CAPS
12.0000 mg | ORAL_CAPSULE | ORAL | Status: AC
  Administered 2024-07-19: 12 mg via ORAL
  Filled 2024-07-19: qty 1

## 2024-07-19 MED ORDER — ENSURE PRE-SURGERY PO LIQD: 296.0000 mL | Freq: Once | ORAL | Status: DC

## 2024-07-19 MED ORDER — PHENYLEPHRINE 80 MCG/ML (10ML) SYRINGE FOR IV PUSH (FOR BLOOD PRESSURE SUPPORT)
PREFILLED_SYRINGE | INTRAVENOUS | Status: DC | PRN
  Administered 2024-07-19 (×4): 80 ug via INTRAVENOUS

## 2024-07-19 MED ORDER — FENTANYL CITRATE PF 50 MCG/ML IJ SOSY
PREFILLED_SYRINGE | INTRAMUSCULAR | Status: AC
  Filled 2024-07-19: qty 1

## 2024-07-19 MED ORDER — METOPROLOL TARTRATE 5 MG/5ML IV SOLN: 5.0000 mg | Freq: Four times a day (QID) | INTRAVENOUS | Status: DC | PRN

## 2024-07-19 MED ORDER — NEOMYCIN SULFATE 500 MG PO TABS: 1000.0000 mg | ORAL_TABLET | ORAL | Status: DC

## 2024-07-19 MED ORDER — FENTANYL CITRATE PF 50 MCG/ML IJ SOSY
25.0000 ug | PREFILLED_SYRINGE | INTRAMUSCULAR | Status: DC | PRN
  Administered 2024-07-19: 50 ug via INTRAVENOUS

## 2024-07-19 MED ORDER — DEXAMETHASONE SODIUM PHOSPHATE 10 MG/ML IJ SOLN
INTRAMUSCULAR | Status: DC | PRN
Start: 2024-07-19 — End: 2024-07-19
  Administered 2024-07-19: 10 mg via INTRAVENOUS

## 2024-07-19 MED ORDER — BUPIVACAINE-EPINEPHRINE 0.25% -1:200000 IJ SOLN
INTRAMUSCULAR | Status: DC | PRN
  Administered 2024-07-19: 30 mL

## 2024-07-19 MED ORDER — ROCURONIUM BROMIDE 100 MG/10ML IV SOLN
INTRAVENOUS | Status: DC | PRN
  Administered 2024-07-19: 50 mg via INTRAVENOUS

## 2024-07-19 MED ORDER — PHENYLEPHRINE HCL-NACL 20-0.9 MG/250ML-% IV SOLN
INTRAVENOUS | Status: DC | PRN
  Administered 2024-07-19: 50 ug/min via INTRAVENOUS

## 2024-07-19 MED ORDER — METRONIDAZOLE 500 MG PO TABS: 1000.0000 mg | ORAL_TABLET | ORAL | Status: DC

## 2024-07-19 MED ORDER — EPHEDRINE SULFATE-NACL 50-0.9 MG/10ML-% IV SOSY
PREFILLED_SYRINGE | INTRAVENOUS | Status: DC | PRN
  Administered 2024-07-19: 7.5 mg via INTRAVENOUS
  Administered 2024-07-19: 5 mg via INTRAVENOUS

## 2024-07-19 MED ORDER — 0.9 % SODIUM CHLORIDE (POUR BTL) OPTIME
TOPICAL | Status: DC | PRN
  Administered 2024-07-19: 2000 mL

## 2024-07-19 MED ORDER — PROPOFOL 500 MG/50ML IV EMUL
INTRAVENOUS | Status: AC
  Filled 2024-07-19: qty 50

## 2024-07-19 MED ORDER — ENOXAPARIN SODIUM 40 MG/0.4ML IJ SOSY
40.0000 mg | PREFILLED_SYRINGE | INTRAMUSCULAR | Status: DC
  Administered 2024-07-20 – 2024-07-21 (×2): 40 mg via SUBCUTANEOUS
  Filled 2024-07-19 (×2): qty 0.4

## 2024-07-19 MED ORDER — ONDANSETRON HCL 4 MG/2ML IJ SOLN: 4.0000 mg | Freq: Four times a day (QID) | INTRAMUSCULAR | Status: DC | PRN

## 2024-07-19 MED ORDER — ONDANSETRON HCL 4 MG/2ML IJ SOLN
4.0000 mg | Freq: Four times a day (QID) | INTRAMUSCULAR | Status: DC | PRN
  Administered 2024-07-19: 4 mg via INTRAVENOUS
  Filled 2024-07-19: qty 2

## 2024-07-19 MED ORDER — SODIUM CHLORIDE 0.45 % IV SOLN: INTRAVENOUS | Status: DC

## 2024-07-19 MED ORDER — LIDOCAINE HCL (CARDIAC) PF 100 MG/5ML IV SOSY
PREFILLED_SYRINGE | INTRAVENOUS | Status: DC | PRN
  Administered 2024-07-19: 60 mg via INTRAVENOUS

## 2024-07-19 MED ORDER — ONDANSETRON HCL 4 MG/2ML IJ SOLN
INTRAMUSCULAR | Status: AC
  Filled 2024-07-19: qty 2

## 2024-07-19 MED ORDER — SODIUM CHLORIDE 0.9 % IV SOLN
2.0000 g | INTRAVENOUS | Status: AC
  Administered 2024-07-19: 2 g via INTRAVENOUS
  Filled 2024-07-19: qty 2

## 2024-07-19 MED ORDER — DIPHENHYDRAMINE HCL 50 MG/ML IJ SOLN: 12.5000 mg | Freq: Four times a day (QID) | INTRAMUSCULAR | Status: DC | PRN

## 2024-07-19 MED ORDER — DEXAMETHASONE SODIUM PHOSPHATE 10 MG/ML IJ SOLN
INTRAMUSCULAR | Status: AC
  Filled 2024-07-19: qty 1

## 2024-07-19 MED ORDER — HYDROMORPHONE HCL 2 MG/ML IJ SOLN
INTRAMUSCULAR | Status: AC
Start: 2024-07-19 — End: 2024-07-19
  Filled 2024-07-19: qty 1

## 2024-07-19 MED ORDER — DIPHENHYDRAMINE HCL 12.5 MG/5ML PO ELIX: 12.5000 mg | ORAL_SOLUTION | Freq: Four times a day (QID) | ORAL | Status: DC | PRN

## 2024-07-19 MED ORDER — GABAPENTIN 300 MG PO CAPS
300.0000 mg | ORAL_CAPSULE | ORAL | Status: AC
  Administered 2024-07-19: 300 mg via ORAL
  Filled 2024-07-19: qty 1

## 2024-07-19 MED ORDER — ACETAMINOPHEN 325 MG PO TABS
650.0000 mg | ORAL_TABLET | Freq: Four times a day (QID) | ORAL | Status: DC | PRN
  Administered 2024-07-20 (×2): 650 mg via ORAL
  Filled 2024-07-19 (×2): qty 2

## 2024-07-19 MED ORDER — CHLORHEXIDINE GLUCONATE 0.12 % MT SOLN
15.0000 mL | Freq: Once | OROMUCOSAL | Status: AC
  Administered 2024-07-19: 15 mL via OROMUCOSAL

## 2024-07-19 SURGICAL SUPPLY — 55 items
BAG COUNTER SPONGE SURGICOUNT (BAG) IMPLANT
BENZOIN TINCTURE PRP APPL 2/3 (GAUZE/BANDAGES/DRESSINGS) IMPLANT
BLADE EXTENDED COATED 6.5IN (ELECTRODE) IMPLANT
BNDG ADH 1X3 SHEER STRL LF (GAUZE/BANDAGES/DRESSINGS) IMPLANT
CABLE HIGH FREQUENCY MONO STRZ (ELECTRODE) IMPLANT
CELLS DAT CNTRL 66122 CELL SVR (MISCELLANEOUS) IMPLANT
CHLORAPREP W/TINT 26 (MISCELLANEOUS) ×1 IMPLANT
CLIP APPLIE 5 13 M/L LIGAMAX5 (MISCELLANEOUS) IMPLANT
CLIP APPLIE ROT 10 11.4 M/L (STAPLE) IMPLANT
COVER SURGICAL LIGHT HANDLE (MISCELLANEOUS) ×1 IMPLANT
DERMABOND ADVANCED .7 DNX12 (GAUZE/BANDAGES/DRESSINGS) IMPLANT
DRAIN CHANNEL 19F RND (DRAIN) IMPLANT
DRSG OPSITE POSTOP 4X10 (GAUZE/BANDAGES/DRESSINGS) IMPLANT
DRSG OPSITE POSTOP 4X6 (GAUZE/BANDAGES/DRESSINGS) IMPLANT
DRSG OPSITE POSTOP 4X8 (GAUZE/BANDAGES/DRESSINGS) IMPLANT
ELECT REM PT RETURN 15FT ADLT (MISCELLANEOUS) ×1 IMPLANT
ENDOLOOP SUT PDS II 0 18 (SUTURE) IMPLANT
GAUZE SPONGE 4X4 12PLY STRL (GAUZE/BANDAGES/DRESSINGS) IMPLANT
GLOVE BIOGEL PI IND STRL 7.0 (GLOVE) ×2 IMPLANT
GLOVE SURG SS PI 7.0 STRL IVOR (GLOVE) ×2 IMPLANT
GOWN STRL REUS W/ TWL LRG LVL3 (GOWN DISPOSABLE) ×2 IMPLANT
IRRIGATION SUCT STRKRFLW 2 WTP (MISCELLANEOUS) ×1 IMPLANT
KIT TURNOVER KIT A (KITS) ×1 IMPLANT
PACK COLON (CUSTOM PROCEDURE TRAY) ×1 IMPLANT
PAD POSITIONING PINK XL (MISCELLANEOUS) IMPLANT
PENCIL SMOKE EVACUATOR (MISCELLANEOUS) ×1 IMPLANT
PROTECTOR NERVE ULNAR (MISCELLANEOUS) IMPLANT
RELOAD STAPLE 60 2.6 WHT THN (STAPLE) IMPLANT
RELOAD STAPLE 75 3.8 BLU REG (ENDOMECHANICALS) IMPLANT
RETRACTOR WND ALEXIS 18 MED (MISCELLANEOUS) IMPLANT
SCISSORS LAP 5X35 DISP (ENDOMECHANICALS) ×1 IMPLANT
SEALER TISSUE X1 STRG JAW 37MM (SHEATH) IMPLANT
SET TUBE SMOKE EVAC HIGH FLOW (TUBING) ×1 IMPLANT
SHEARS HARMONIC 36 ACE (MISCELLANEOUS) ×1 IMPLANT
SLEEVE Z-THREAD 5X100MM (TROCAR) ×2 IMPLANT
SPIKE FLUID TRANSFER (MISCELLANEOUS) ×1 IMPLANT
STAPLER ECHELON LONG 60 440 (INSTRUMENTS) IMPLANT
STAPLER GUN LINEAR PROX 60 (STAPLE) IMPLANT
STAPLER PROXIMATE 75MM BLUE (STAPLE) IMPLANT
STAPLER SKIN PROX 35W (STAPLE) IMPLANT
STRIP CLOSURE SKIN 1/2X4 (GAUZE/BANDAGES/DRESSINGS) IMPLANT
SUT PDS AB 0 CT1 36 (SUTURE) IMPLANT
SUT PROLENE 2 0 KS (SUTURE) IMPLANT
SUT SILK 2 0 SH CR/8 (SUTURE) ×1 IMPLANT
SUT SILK 2-0 18XBRD TIE 12 (SUTURE) ×1 IMPLANT
SUT SILK 3 0 SH CR/8 (SUTURE) ×1 IMPLANT
SUT SILK 3-0 18XBRD TIE 12 (SUTURE) ×1 IMPLANT
SUT VIC AB 2-0 SH 27X BRD (SUTURE) ×1 IMPLANT
SUT VICRYL 0 ENDOLOOP (SUTURE) IMPLANT
SYSTEM LAPSCP GELPORT 120MM (MISCELLANEOUS) IMPLANT
SYSTEM WOUND ALEXIS 18CM MED (MISCELLANEOUS) IMPLANT
TAPE CLOTH 4X10 WHT NS (GAUZE/BANDAGES/DRESSINGS) IMPLANT
TRAY FOLEY MTR SLVR 16FR STAT (SET/KITS/TRAYS/PACK) IMPLANT
TROCAR ADV FIXATION 12X100MM (TROCAR) ×1 IMPLANT
TROCAR Z-THREAD OPTICAL 5X100M (TROCAR) ×1 IMPLANT

## 2024-07-19 NOTE — Plan of Care (Signed)
  Problem: Education: Goal: Understanding of discharge needs will improve Outcome: Progressing Goal: Verbalization of understanding of the causes of altered bowel function will improve Outcome: Progressing   Problem: Activity: Goal: Ability to tolerate increased activity will improve Outcome: Progressing   Problem: Bowel/Gastric: Goal: Gastrointestinal status for postoperative course will improve Outcome: Progressing   Problem: Health Behavior/Discharge Planning: Goal: Identification of community resources to assist with postoperative recovery needs will improve Outcome: Progressing

## 2024-07-19 NOTE — Op Note (Signed)
 Preoperative diagnosis: colon cancer  Postoperative diagnosis: same   Procedure: laparoscopic partial colectomy with ileocolic anastomosis  Surgeon: Zoe Bureau, M.D.  Asst: Cathlyn Idler, M.D.  Anesthesia: GETA  Indications for procedure: Zoe Sherman is a 63 y.o. 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 She presents for partial colon resection.  Description of procedure: The patient was brought into the operative suite. Anesthesia was administered with General endotracheal anesthesia. WHO checklist was applied. The patient was then placed in supine position. The area was prepped and draped in the usual sterile fashion.  Next, a left subcostal incision was made. A 5mm trocar was used to gain access to the peritoneal cavity by optical entry technique. Pneumoperitoneum was applied with a high flow and low pressure. The laparoscope was reinserted to confirm position.  Upon evaluation of the abdomen, there were omental adhesions to the mid abdomen.  These were taken down bluntly.  One 5 mm trocar was placed in the left mid abdomen, one 5 mm trocar was placed in the right lower quadrant, and one 12 mm trocar was placed in the supraumbilical space.  Bilateral T AP blocks were placed with Marcaine .  The appendix terminal ileum and cecum were identified.  There were some adhesions of the terminal ileum to the right sidewall.  These were taken down sharply.  Next, the white line in Toldt was incised with Enseal device.  This was continued up to the hepatic flexure.  Next, adhesions of the omentum to the gallbladder were removed sharply.  Peritoneal reflection of the transverse colon was divided with Enseal device.  This allowed for the entire right colon to be mobile to the midline.  Next, the cecum was put in retraction to the right lower quadrant peritoneal reflection of the mesocolon was incised and the mesocolon was bluntly dissected to identify the ileocolic  vessels.  These were divided with Enseal device.  Care was taken to ensure good amount of mesentery for lymph node harvest.  Dissection was continued superiorly, the duodenum was identified and safely left alone.  Next, the supraumbilical incision was enlarged and wound protector put in place.  The right colon and ileum and appendix were brought out through this hole.  Portion about 10 cm from the ileocecal valve was chosen for division of the ileum.  This was done with a 75 mm blue load GIA stapler.  Portion of the proximal transverse colon was chosen for division this was done with a blue load 75 mm GIA stapler.  Next, a side-to-side functional end-to-end anastomosis was made with a 75 mm blue load GIA stapler.  The enterotomy was closed with a 60 mm TA stapler.  3-0 silk was used to imbricate the staple line edges.  3-0 silk was used for antitension stitch.  The anastomosis appeared widely patent.  The ileocolic anastomosis was placed back into the abdomen.  Pneumoperitoneum was restarted and the colon was assessed.  Hemostasis appeared intact.  Irrigation was performed.   Colorectal changeover was performed.  Fascia was closed in running fashion with 0 PDS.  All skin incisions were closed with 4 Monocryl in subcuticular stitch.  Dermabond was put in place for dressing.  Patient woke from anesthesia and was brought to PACU in stable condition.  Findings: palpable mass in the periappendiceal cecum  Specimen: right colon, appendix, and terminal ileum  Implant: none   Blood loss: 50 ml  Local anesthesia: 30 ml marcaine    Complications: none  Zoe  Jaelyn Sherman, M.D. General, Bariatric, & Minimally Invasive Surgery [facility]

## 2024-07-19 NOTE — Anesthesia Procedure Notes (Addendum)
 Procedure Name: Intubation Date/Time: 07/19/2024 9:50 AM  Performed by: Delores Duwaine SAUNDERS, CRNAPre-anesthesia Checklist: Patient identified, Emergency Drugs available, Suction available and Patient being monitored Patient Re-evaluated:Patient Re-evaluated prior to induction Oxygen Delivery Method: Circle System Utilized Preoxygenation: Pre-oxygenation with 100% oxygen Induction Type: IV induction Ventilation: Mask ventilation without difficulty Laryngoscope Size: Mac and 3 Grade View: Grade II Tube type: Oral Tube size: 7.0 mm Number of attempts: 1 Airway Equipment and Method: Stylet and Oral airway Placement Confirmation: ETT inserted through vocal cords under direct vision, positive ETCO2 and breath sounds checked- equal and bilateral Secured at: 23 cm Tube secured with: Tape Dental Injury: Teeth and Oropharynx as per pre-operative assessment  Comments: No complications. Grade 2a view, performed by Jacques KET.

## 2024-07-19 NOTE — H&P (Signed)
 Chief Complaint  Patient presents with  Cecal Mass- adenocarcinoma   Subjective   Zoe Sherman is a 63 y.o. female new patient in today for: History of Present Illness Zoe Sherman is a 63 year old female who presents for follow-up regarding a colonoscopy finding of cancer at the base of the appendix. She is accompanied by a family member, possibly her spouse, who participates in the discussion.  A screening colonoscopy revealed a cancerous mass at the base of the appendix on the right side of the colon. She has no symptoms such as rectal bleeding, weight loss, or changes in diet or bowel habits. Her surgical history includes a laparoscopic hysterectomy with no other abdominal surgeries.  Social Drivers of Health with Concerns   Housing Stability: Unknown (06/24/2024)  Housing Stability Vital Sign  Homeless in the Last Year: No    No data to display      Outpatient Medications Prior to Visit  Medication Sig Dispense Refill  amLODIPine  (NORVASC ) 5 MG tablet Take 5 mg by mouth once daily  atorvastatin (LIPITOR) 20 MG tablet Take 20 mg by mouth once daily  olmesartan-hydroCHLOROthiazide (BENICAR HCT) 20-12.5 mg tablet Take 1 tablet by mouth once daily   No facility-administered medications prior to visit.    Objective   Vitals:  06/24/24 1433  BP: (!) 142/78  Pulse: 80  Temp: 37.1 C (98.7 F)  SpO2: 98%  Weight: 63.7 kg (140 lb 6.4 oz)  Height: 165.1 cm (5' 5)  PainSc: 0-No pain   Body mass index is 23.36 kg/m. Physical Exam Constitutional:  Appearance: Normal appearance.  HENT:  Head: Normocephalic and atraumatic.  Pulmonary:  Effort: Pulmonary effort is normal.   Musculoskeletal:  General: Normal range of motion.  Cervical back: Normal range of motion.   Neurological:  General: No focal deficit present.  Mental Status: She is alert and oriented to person, place, and time. Mental status is at baseline.   Psychiatric:  Mood and Affect: Mood normal.   Behavior: Behavior normal.  Thought Content: Thought content normal.    I reviewed colonoscopy showing unresectable polyp at the appendiceal orifice. 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 No symptoms like rectal bleeding, weight loss, or bowel habit changes. Pathology confirmed malignancy. - Schedule laparoscopic resection of colon and appendix with general anesthesia and staplers for intestinal reconnection. - Administer antibiotics and bowel prep pre-surgery to reduce infection and leakage risks. - Plan 2-6 day hospital stay post-surgery, with 4-8 week recovery. - Advise low fiber diet for 3 weeks post-surgery. - Discuss potential systemic therapy if lymph nodes are positive. - Aim to obtain at least 12 lymph nodes for evaluation. Consider systemic therapy if positive. - Leakage risk <1%. Major treatments include antibiotics and possibly an ostomy if leakage occurs.  Diagnoses and all orders for this visit:  Malignant neoplasm of ascending colon (CMS/HHS-HCC) -Ct without metastatic findings -Partial colectomy with ileocolic anastomosis -inpatient stay

## 2024-07-19 NOTE — Anesthesia Preprocedure Evaluation (Signed)
Anesthesia Evaluation  Patient identified by MRN, date of birth, ID band Patient awake    Reviewed: Allergy & Precautions, H&P , NPO status , Patient's Chart, lab work & pertinent test results  Airway Mallampati: II   Neck ROM: full    Dental   Pulmonary neg pulmonary ROS   breath sounds clear to auscultation       Cardiovascular hypertension,  Rhythm:regular Rate:Normal     Neuro/Psych    GI/Hepatic Colon CA   Endo/Other    Renal/GU      Musculoskeletal   Abdominal   Peds  Hematology   Anesthesia Other Findings   Reproductive/Obstetrics                             Anesthesia Physical Anesthesia Plan  ASA: 2  Anesthesia Plan: General   Post-op Pain Management:    Induction: Intravenous  PONV Risk Score and Plan: 3 and Ondansetron, Dexamethasone, Midazolam and Treatment may vary due to age or medical condition  Airway Management Planned: Oral ETT  Additional Equipment:   Intra-op Plan:   Post-operative Plan: Extubation in OR  Informed Consent: I have reviewed the patients History and Physical, chart, labs and discussed the procedure including the risks, benefits and alternatives for the proposed anesthesia with the patient or authorized representative who has indicated his/her understanding and acceptance.     Dental advisory given  Plan Discussed with: CRNA, Anesthesiologist and Surgeon  Anesthesia Plan Comments:        Anesthesia Quick Evaluation

## 2024-07-19 NOTE — Transfer of Care (Signed)
 Immediate Anesthesia Transfer of Care Note  Patient: Zoe Sherman  Procedure(s) Performed: LAPAROSCOPIC PARTIAL COLECTOMY  Patient Location: PACU  Anesthesia Type:General  Level of Consciousness: drowsy  Airway & Oxygen Therapy: Patient Spontanous Breathing  Post-op Assessment: Report given to RN  Post vital signs: Reviewed and stable  Last Vitals:  Vitals Value Taken Time  BP 120/59 07/19/24 11:50  Temp    Pulse 83 07/19/24 11:53  Resp 16 07/19/24 11:53  SpO2 100 % 07/19/24 11:53  Vitals shown include unfiled device data.  Last Pain:  Vitals:   07/19/24 0742  TempSrc:   PainSc: 0-No pain      Patients Stated Pain Goal: 4 (07/19/24 0734)  Complications: No notable events documented.

## 2024-07-19 NOTE — Anesthesia Postprocedure Evaluation (Signed)
 Anesthesia Post Note  Patient: Zoe Sherman  Procedure(s) Performed: LAPAROSCOPIC PARTIAL COLECTOMY     Patient location during evaluation: PACU Anesthesia Type: General Level of consciousness: awake and alert Pain management: pain level controlled Vital Signs Assessment: post-procedure vital signs reviewed and stable Respiratory status: spontaneous breathing, nonlabored ventilation, respiratory function stable and patient connected to nasal cannula oxygen Cardiovascular status: blood pressure returned to baseline and stable Postop Assessment: no apparent nausea or vomiting Anesthetic complications: no   No notable events documented.  Last Vitals:  Vitals:   07/19/24 1230 07/19/24 1241  BP: (!) 109/53   Pulse: 64   Resp: 14   Temp:  (!) 36.4 C  SpO2: 99%     Last Pain:  Vitals:   07/19/24 1226  TempSrc:   PainSc: 2                  Breylan Lefevers S

## 2024-07-20 ENCOUNTER — Encounter (HOSPITAL_COMMUNITY): Payer: Self-pay | Admitting: General Surgery

## 2024-07-20 LAB — CBC
HCT: 33.2 % — ABNORMAL LOW (ref 36.0–46.0)
Hemoglobin: 11.2 g/dL — ABNORMAL LOW (ref 12.0–15.0)
MCH: 30.8 pg (ref 26.0–34.0)
MCHC: 33.7 g/dL (ref 30.0–36.0)
MCV: 91.2 fL (ref 80.0–100.0)
Platelets: 202 K/uL (ref 150–400)
RBC: 3.64 MIL/uL — ABNORMAL LOW (ref 3.87–5.11)
RDW: 13 % (ref 11.5–15.5)
WBC: 12.3 K/uL — ABNORMAL HIGH (ref 4.0–10.5)
nRBC: 0 % (ref 0.0–0.2)

## 2024-07-20 LAB — BASIC METABOLIC PANEL WITH GFR
Anion gap: 10 (ref 5–15)
BUN: 20 mg/dL (ref 6–20)
CO2: 21 mmol/L — ABNORMAL LOW (ref 22–32)
Calcium: 8.5 mg/dL — ABNORMAL LOW (ref 8.9–10.3)
Chloride: 102 mmol/L (ref 98–111)
Creatinine, Ser: 1.19 mg/dL — ABNORMAL HIGH (ref 0.44–1.00)
GFR, Estimated: 52 mL/min — ABNORMAL LOW (ref >60)
Glucose, Bld: 115 mg/dL — ABNORMAL HIGH (ref 70–99)
Potassium: 3.7 mmol/L (ref 3.5–5.1)
Sodium: 133 mmol/L — ABNORMAL LOW (ref 135–145)

## 2024-07-20 NOTE — Progress Notes (Signed)
   07/20/24 0946  TOC Brief Assessment  Insurance and Status Reviewed  Patient has primary care physician Yes  Home environment has been reviewed resides in private residence  Prior level of function: Independent  Prior/Current Home Services No current home services  Social Drivers of Health Review SDOH reviewed no interventions necessary  Readmission risk has been reviewed Yes  Transition of care needs no transition of care needs at this time

## 2024-07-20 NOTE — Plan of Care (Signed)

## 2024-07-20 NOTE — Progress Notes (Signed)
  1 Day Post-Op   Chief Complaint/Subjective: She is tolerating liquids, she had 1 BM this morning. She denies flatus.  Objective: Vital signs in last 24 hours: Temp:  [97.5 F (36.4 C)-99.2 F (37.3 C)] 98.7 F (37.1 C) (07/29 1031) Pulse Rate:  [64-76] 76 (07/29 1031) Resp:  [13-18] 14 (07/29 1031) BP: (102-118)/(50-61) 118/56 (07/29 1031) SpO2:  [93 %-100 %] 100 % (07/29 1031) Weight:  [65.2 kg] 65.2 kg (07/29 0500)   Intake/Output from previous day: 07/28 0701 - 07/29 0700 In: 2831.4 [P.O.:960; I.V.:1771.4; IV Piggyback:100] Out: 40 [Urine:20; Blood:20]  PE: Gen: NAD Resp: nonlabored Card: RRR Abd: soft, incision c/d/i  Lab Results:  Recent Labs    07/19/24 1205 07/20/24 0410  WBC 12.8* 12.3*  HGB 12.4 11.2*  HCT 36.6 33.2*  PLT 221 202   Recent Labs    07/19/24 1205 07/20/24 0410  NA  --  133*  K  --  3.7  CL  --  102  CO2  --  21*  GLUCOSE  --  115*  BUN  --  20  CREATININE 1.39* 1.19*  CALCIUM  --  8.5*   No results for input(s): LABPROT, INR in the last 72 hours.    Component Value Date/Time   NA 133 (L) 07/20/2024 0410   NA 144 06/02/2017 1603   K 3.7 07/20/2024 0410   CL 102 07/20/2024 0410   CO2 21 (L) 07/20/2024 0410   GLUCOSE 115 (H) 07/20/2024 0410   BUN 20 07/20/2024 0410   BUN 17 06/02/2017 1603   CREATININE 1.19 (H) 07/20/2024 0410   CALCIUM 8.5 (L) 07/20/2024 0410   PROT 7.1 07/09/2024 1027   PROT 6.7 06/02/2017 1603   ALBUMIN 4.3 07/09/2024 1027   ALBUMIN 4.6 06/02/2017 1603   AST 25 07/09/2024 1027   ALT 26 07/09/2024 1027   ALKPHOS 95 07/09/2024 1027   BILITOT 0.8 07/09/2024 1027   BILITOT 0.2 06/02/2017 1603   GFRNONAA 52 (L) 07/20/2024 0410   GFRAA 88 06/02/2017 1603    Assessment/Plan  s/p Procedure(s): LAPAROSCOPIC PARTIAL COLECTOMY 07/19/2024    FEN - soft diet VTE - lovenox  ID - periop abx Disposition - pain control, ambulate, ensure return of bowel function   LOS: 1 day   I reviewed last 24 h  vitals and pain scores, last 48 h intake and output, last 24 h labs and trends, and last 24 h imaging results.  This care required moderate level of medical decision making.   Herlene Righter [facility] 07/20/2024, 1:32 PM Please see Amion for pager number during day hours 7:00am-4:30pm or 7:00am -11:30am on weekends

## 2024-07-21 NOTE — Discharge Summary (Signed)
 Physician Discharge Summary  Zoe Sherman FMW:[id number] DOB: [birth year] DOA: 07/19/2024  PCP: Zoe Norleen PEDLAR, MD  Admit date: 07/19/2024 Discharge date:  07/21/2024   Recommendations for Outpatient Follow-up:   (include homehealth, outpatient follow-up instructions, specific recommendations for PCP to follow-up on, etc.)   Follow-up Information     Zoe Sherman, Zoe Righter, MD Follow up on 08/12/2024.   Specialty: General Surgery Contact information: 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 Post op she was admitted to the surgical floor. She was advanced diet. She had return of bowel function POD 1 and was discharged home POD 2.  Discharge Instructions  Discharge Instructions     Diet - low sodium heart healthy   Complete by: As directed    Discharge wound care:   Complete by: As directed    Shower normal tomorrow. Glue to stay on for 10-14 days. No bandage needed.   Increase activity slowly   Complete by: As directed       Allergies as of 07/21/2024       Reactions   Tetracyclines & Related Other (See Comments)   Teenager        Medication List     TAKE these medications    amLODipine  5 MG tablet Commonly known as: NORVASC  Take 1 tablet (5 mg total) by mouth daily. What changed: when to take this   atorvastatin 20 MG tablet Commonly known as: LIPITOR Take 20 mg by mouth every evening.   metroNIDAZOLE  500 MG tablet Commonly known as: FLAGYL  Take 1,000 mg by mouth once.   neomycin  500 MG tablet Commonly known as: MYCIFRADIN  Take 1,000 mg by mouth 3 (three) times daily.    olmesartan-hydrochlorothiazide 20-12.5 MG tablet Commonly known as: BENICAR HCT Take 1 tablet by mouth in the morning.   polyethylene glycol powder 17 GM/SCOOP powder Commonly known as: GLYCOLAX/MIRALAX Take 119 g by mouth once.               Discharge Care Instructions  (From admission, onward)           Start     Ordered   07/21/24 0000  Discharge wound care:       Comments: Shower normal tomorrow. Glue to stay on for 10-14 days. No bandage needed.   07/21/24 1654            Follow-up Information     Zoe Sherman, Zoe Righter, MD Follow up on 08/12/2024.   Specialty: General Surgery Contact information: [address] [place] [address] [phone]                  The results of significant diagnostics from this hospitalization (including imaging, microbiology, ancillary and laboratory) are listed below for reference.    Significant Diagnostic Studies: MM 3D SCREENING MAMMOGRAM BILATERAL BREAST Result Date: 07/07/2024 CLINICAL DATA:  Screening. EXAM: DIGITAL SCREENING BILATERAL MAMMOGRAM WITH TOMOSYNTHESIS AND CAD TECHNIQUE: Bilateral screening digital craniocaudal and mediolateral oblique mammograms were obtained. Bilateral screening digital breast tomosynthesis was performed. The images were evaluated with computer-aided detection. COMPARISON:  Previous exam(s). ACR Breast Density Category a:  The breasts are almost entirely fatty. FINDINGS: There are no findings suspicious for malignancy. IMPRESSION: No mammographic evidence of malignancy. A result letter of this screening mammogram will be mailed directly to the patient. RECOMMENDATION: Screening mammogram in one year. (Code:SM-B-01Y) BI-RADS CATEGORY  1: Negative. Electronically Signed   By: Zoe Sherman M.D.   On: 07/07/2024 14:42   CT CHEST ABDOMEN PELVIS W CONTRAST Result Date: 07/07/2024 CLINICAL DATA:  History provided by technologist Patient had Cologuard test, positive.  Colonoscopy 07/25, cancerous polyp in the ascending colon. * Tracking Code: BO * EXAM: CT CHEST, ABDOMEN, AND PELVIS WITH CONTRAST TECHNIQUE: Multidetector CT imaging of the chest, abdomen and pelvis was performed following the standard protocol during bolus administration of intravenous contrast. RADIATION DOSE REDUCTION: This exam was performed according to the departmental dose-optimization program which includes automated exposure control, adjustment of the mA and/or kV according to patient size and/or use of iterative reconstruction technique. CONTRAST:  100mL ISOVUE -300 IOPAMIDOL  (ISOVUE -300) INJECTION 61% COMPARISON:  None Available. FINDINGS: CT CHEST FINDINGS Cardiovascular: Normal cardiac size. No pericardial effusion. No aortic aneurysm. Mediastinum/Nodes: Visualized thyroid gland appears grossly unremarkable. No solid / cystic mediastinal masses. The esophagus is nondistended precluding optimal assessment. No axillary, mediastinal or hilar lymphadenopathy by size criteria. Lungs/Pleura: The central tracheo-bronchial tree is patent. There are patchy areas of linear, plate-like atelectasis and/or scarring throughout bilateral lungs. No mass or consolidation. No pleural effusion or pneumothorax. No suspicious lung nodules. Musculoskeletal: The visualized soft tissues of the chest wall are grossly unremarkable. No suspicious osseous lesions. There are mild multilevel degenerative changes in the visualized spine. CT ABDOMEN PELVIS FINDINGS Hepatobiliary: The liver is normal in size. Non-cirrhotic configuration. No suspicious mass. No intrahepatic or extrahepatic bile duct dilation. No calcified gallstones. Normal gallbladder wall thickness. No pericholecystic inflammatory changes. Pancreas: Unremarkable. No pancreatic ductal dilatation or surrounding inflammatory changes. Spleen: Within normal limits. No focal lesion. Adrenals/Urinary Tract: Adrenal glands are unremarkable. No suspicious renal mass. No  hydronephrosis. No renal or ureteric calculi. Unremarkable urinary bladder. Stomach/Bowel: There is a tiny sliding hiatal hernia. No disproportionate dilation of the small or large bowel loops. No evidence of abnormal bowel wall thickening or inflammatory changes. The appendix is unremarkable. Vascular/Lymphatic: No ascites or pneumoperitoneum. No abdominal or pelvic lymphadenopathy, by size criteria. No aneurysmal dilation of the major abdominal arteries. Reproductive: The uterus is surgically absent. No large adnexal mass. Other: There is a tiny fat containing umbilical hernia. The soft tissues and abdominal wall are otherwise unremarkable. Musculoskeletal: No suspicious osseous lesions. There are mild multilevel degenerative changes in the visualized spine. IMPRESSION: 1. No metastatic disease identified within the chest, abdomen or pelvis. 2. Otherwise essentially unremarkable exam, as described above. Electronically Signed   By: Ree Molt M.D.   On: 07/07/2024 11:09    Labs: Basic Metabolic Panel: Recent Labs  Lab 07/19/24 1205 07/20/24 0410  NA  --  133*  K  --  3.7  CL  --  102  CO2  --  21*  GLUCOSE  --  115*  BUN  --  20  CREATININE 1.39* 1.19*  CALCIUM  --  8.5*   Liver Function Tests: No results for input(s): AST, ALT, ALKPHOS, BILITOT, PROT, ALBUMIN in the last 168 hours.  CBC: Recent Labs  Lab 07/19/24 1205 07/20/24 0410  WBC 12.8* 12.3*  HGB 12.4 11.2*  HCT 36.6 33.2*  MCV 90.6 91.2  PLT 221 202    CBG: No results for input(s): GLUCAP in the last  168 hours.  Principal Problem:   Colon cancer (HCC)   Time coordinating discharge: 15 min

## 2024-07-21 NOTE — Discharge Instructions (Signed)
Managing Your Pain After Surgery Without Opioids    Thank you for participating in our program to help patients manage their pain after surgery without opioids. This is part of our effort to provide you with the best care possible, without exposing you or your family to the risk that opioids pose.  What pain can I expect after surgery? You can expect to have some pain after surgery. This is normal. The pain is typically worse the day after surgery, and quickly begins to get better. Many studies have found that many patients are able to manage their pain after surgery with Over-the-Counter (OTC) medications such as Tylenol and Motrin. If you have a condition that does not allow you to take Tylenol or Motrin, notify your surgical team.  How will I manage my pain? The best strategy for controlling your pain after surgery is around the clock pain control with Tylenol (acetaminophen) and Motrin (ibuprofen or Advil). Alternating these medications with each other allows you to maximize your pain control. In addition to Tylenol and Motrin, you can use heating pads or ice packs on your incisions to help reduce your pain.  How will I alternate your regular strength over-the-counter pain medication? You will take a dose of pain medication every three hours. Start by taking 650 mg of Tylenol (2 pills of 325 mg) 3 hours later take 600 mg of Motrin (3 pills of 200 mg) 3 hours after taking the Motrin take 650 mg of Tylenol 3 hours after that take 600 mg of Motrin.   - 1 -  See example - if your first dose of Tylenol is at 12:00 PM   12:00 PM Tylenol 650 mg (2 pills of 325 mg)  3:00 PM Motrin 600 mg (3 pills of 200 mg)  6:00 PM Tylenol 650 mg (2 pills of 325 mg)  9:00 PM Motrin 600 mg (3 pills of 200 mg)  Continue alternating every 3 hours   We recommend that you follow this schedule around-the-clock for at least 3 days after surgery, or until you feel that it is no longer needed. Use the table  on the last page of this handout to keep track of the medications you are taking. Important: Do not take more than '3000mg'$  of Tylenol or '3200mg'$  of Motrin in a 24-hour period. Do not take ibuprofen/Motrin if you have a history of bleeding stomach ulcers, severe kidney disease, &/or actively taking a blood thinner  What if I still have pain? If you have pain that is not controlled with the over-the-counter pain medications (Tylenol and Motrin or Advil) you might have what we call "breakthrough" pain. You will receive a prescription for a small amount of an opioid pain medication such as Oxycodone, Tramadol, or Tylenol with Codeine. Use these opioid pills in the first 24 hours after surgery if you have breakthrough pain. Do not take more than 1 pill every 4-6 hours.  If you still have uncontrolled pain after using all opioid pills, don't hesitate to call our staff using the number provided. We will help make sure you are managing your pain in the best way possible, and if necessary, we can provide a prescription for additional pain medication.   Day 1    Time  Name of Medication Number of pills taken  Amount of Acetaminophen  Pain Level   Comments  AM PM       AM PM       AM PM  AM PM       AM PM       AM PM       AM PM       AM PM       Total Daily amount of Acetaminophen Do not take more than  3,000 mg per day      Day 2    Time  Name of Medication Number of pills taken  Amount of Acetaminophen  Pain Level   Comments  AM PM       AM PM       AM PM       AM PM       AM PM       AM PM       AM PM       AM PM       Total Daily amount of Acetaminophen Do not take more than  3,000 mg per day      Day 3    Time  Name of Medication Number of pills taken  Amount of Acetaminophen  Pain Level   Comments  AM PM       AM PM       AM PM       AM PM         AM PM       AM PM       AM PM       AM PM       Total Daily amount of Acetaminophen Do not take more  than  3,000 mg per day      Day 4    Time  Name of Medication Number of pills taken  Amount of Acetaminophen  Pain Level   Comments  AM PM       AM PM       AM PM       AM PM       AM PM       AM PM       AM PM       AM PM       Total Daily amount of Acetaminophen Do not take more than  3,000 mg per day      Day 5    Time  Name of Medication Number of pills taken  Amount of Acetaminophen  Pain Level   Comments  AM PM       AM PM       AM PM       AM PM       AM PM       AM PM       AM PM       AM PM       Total Daily amount of Acetaminophen Do not take more than  3,000 mg per day      Day 6    Time  Name of Medication Number of pills taken  Amount of Acetaminophen  Pain Level  Comments  AM PM       AM PM       AM PM       AM PM       AM PM       AM PM       AM PM       AM PM       Total Daily amount of Acetaminophen Do not take more than  3,000 mg  per day      Day 7    Time  Name of Medication Number of pills taken  Amount of Acetaminophen  Pain Level   Comments  AM PM       AM PM       AM PM       AM PM       AM PM       AM PM       AM PM       AM PM       Total Daily amount of Acetaminophen Do not take more than  3,000 mg per day        For additional information about how and where to safely dispose of unused opioid medications - [employer]  Disclaimer: This document contains information and/or instructional materials adapted from [place] for the typical patient with your condition. It does not replace medical advice from your health care provider because your experience may differ from that of the typical patient. Talk to your health care provider if you have any questions about this document, your condition or your treatment plan. Adapted from [place]

## 2024-07-21 NOTE — Plan of Care (Signed)

## 2024-07-21 NOTE — Progress Notes (Signed)
 Pharmacy Brief Note - Alvimopan  (Entereg )  The standing order set for alvimopan  (Entereg ) now includes an automatic order to discontinue the drug after the patient has had a bowel movement. The change was approved by the Pharmacy & Therapeutics Committee and the Medical Executive Committee.   This patient has had bowel movements documented by nursing. Therefore, alvimopan  has been discontinued. If there are questions, please contact the pharmacy at [phone].   Thank you-   Iantha Batch, PharmD, BCPS 07/21/2024 9:33 AM

## 2024-07-23 LAB — SURGICAL PATHOLOGY

## 2024-08-04 ENCOUNTER — Encounter (HOSPITAL_COMMUNITY): Payer: Self-pay

## 2024-08-24 ENCOUNTER — Telehealth: Payer: Self-pay

## 2024-08-24 NOTE — Telephone Encounter (Signed)
 Please nic repeat colonoscopy in 1 yr

## 2024-08-24 NOTE — Telephone Encounter (Signed)
-----   Message from Zoe Sherman sent at 08/23/2024  2:56 PM EDT ----- Patient needs to be recalled in 1 year for follow-up colonoscopy from colon cancer resection.

## 2024-11-12 DIAGNOSIS — L821 Other seborrheic keratosis: Secondary | ICD-10-CM | POA: Diagnosis not present

## 2024-11-12 DIAGNOSIS — D1801 Hemangioma of skin and subcutaneous tissue: Secondary | ICD-10-CM | POA: Diagnosis not present

## 2024-11-12 DIAGNOSIS — D2371 Other benign neoplasm of skin of right lower limb, including hip: Secondary | ICD-10-CM | POA: Diagnosis not present

## 2024-11-12 DIAGNOSIS — L814 Other melanin hyperpigmentation: Secondary | ICD-10-CM | POA: Diagnosis not present

## 2024-11-12 DIAGNOSIS — Z08 Encounter for follow-up examination after completed treatment for malignant neoplasm: Secondary | ICD-10-CM | POA: Diagnosis not present

## 2024-11-12 DIAGNOSIS — Z85828 Personal history of other malignant neoplasm of skin: Secondary | ICD-10-CM | POA: Diagnosis not present
